# Patient Record
Sex: Female | Born: 1960 | Race: White | Hispanic: No | Marital: Married | Smoking: Never smoker
Health system: Southern US, Community
[De-identification: ages and names within clinical notes are randomized; demographics above are authoritative.]

## PROBLEM LIST (undated history)

## (undated) DIAGNOSIS — M419 Scoliosis, unspecified: Secondary | ICD-10-CM

## (undated) DIAGNOSIS — K219 Gastro-esophageal reflux disease without esophagitis: Secondary | ICD-10-CM

## (undated) DIAGNOSIS — Z8489 Family history of other specified conditions: Secondary | ICD-10-CM

## (undated) DIAGNOSIS — I10 Essential (primary) hypertension: Secondary | ICD-10-CM

## (undated) DIAGNOSIS — E785 Hyperlipidemia, unspecified: Secondary | ICD-10-CM

## (undated) DIAGNOSIS — M810 Age-related osteoporosis without current pathological fracture: Secondary | ICD-10-CM

## (undated) DIAGNOSIS — D72819 Decreased white blood cell count, unspecified: Secondary | ICD-10-CM

## (undated) DIAGNOSIS — F419 Anxiety disorder, unspecified: Secondary | ICD-10-CM

## (undated) HISTORY — PX: COLONOSCOPY WITH PROPOFOL: SHX5780

## (undated) HISTORY — PX: HYSTEROSCOPY DIAGNOSTIC: PRO49

---

## 2006-01-13 ENCOUNTER — Ambulatory Visit: Payer: Self-pay | Admitting: Unknown Physician Specialty

## 2007-04-18 ENCOUNTER — Ambulatory Visit: Payer: Self-pay | Admitting: Unknown Physician Specialty

## 2007-08-17 ENCOUNTER — Emergency Department (HOSPITAL_COMMUNITY): Admission: EM | Admit: 2007-08-17 | Discharge: 2007-08-17 | Payer: Self-pay | Admitting: Emergency Medicine

## 2007-09-01 ENCOUNTER — Ambulatory Visit: Payer: Self-pay | Admitting: Internal Medicine

## 2007-09-13 ENCOUNTER — Ambulatory Visit: Payer: Self-pay | Admitting: Thoracic Surgery

## 2007-10-10 ENCOUNTER — Ambulatory Visit: Payer: Self-pay | Admitting: Thoracic Surgery

## 2007-10-10 ENCOUNTER — Encounter: Admission: RE | Admit: 2007-10-10 | Discharge: 2007-10-10 | Payer: Self-pay | Admitting: Thoracic Surgery

## 2007-12-13 ENCOUNTER — Encounter: Admission: RE | Admit: 2007-12-13 | Discharge: 2007-12-13 | Payer: Self-pay | Admitting: Thoracic Surgery

## 2007-12-13 ENCOUNTER — Ambulatory Visit: Payer: Self-pay | Admitting: Thoracic Surgery

## 2008-04-23 ENCOUNTER — Ambulatory Visit: Payer: Self-pay | Admitting: Unknown Physician Specialty

## 2009-04-22 ENCOUNTER — Ambulatory Visit: Payer: Self-pay | Admitting: Unknown Physician Specialty

## 2010-04-28 ENCOUNTER — Ambulatory Visit: Payer: Self-pay | Admitting: Unknown Physician Specialty
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
## 2010-09-08 NOTE — Assessment & Plan Note (Signed)
OFFICE VISIT   MEOSHIA, BILLING  DOB:  June 07, 1960                                        October 10, 2007  CHART #:  [id number]   Ms. Krinke came for followup today.  She has no sternal pain.  Her  sternum is stable.  Chest x-ray shows severe scoliosis, no effusion, and  we can see her sternal fracture, which appears to be healing and forming  some callus formation, but it still has some depression there.  We plan  to see her back again in 2 months with another chest x-ray.  I released  her to full activity.   Ines Bloomer, M.D.  Electronically Signed   DPB/MEDQ  D:  10/10/2007  T:  10/11/2007  Job:  [id number]

## 2010-09-08 NOTE — Letter (Signed)
December 13, 2007   Kizzie Ide. Particia Nearing, MD  [address]  [place], [place] [address]   Re:  Megan Lin, Megan Lin              DOB:  1961/01/12   Dear Dr. Particia Nearing:   I saw the patient back today for a followup of her sternal fracture.  Obviously, her chest x-ray still shows a thoracic scoliosis as well as  some step off of her sternal fracture, but she can see callus formation  and appears to be healing satisfactory and she has minimal pain and no  evidence of any movement.  She probably remained like this for now on  and do not feel anything else needed to be done.  I would suggest that  she probably get a bone density in the near future to see if she needs  to go on any medications.  I will be happy to see her again if she has  any future problems.  Her blood pressure was 122/84, pulse 82,  respirations 20, and sats were 100%.  Lungs were clear to auscultation  and percussion.   Ines Bloomer, M.D.  Electronically Signed   DPB/MEDQ  D:  12/13/2007  T:  12/13/2007  Job:  [id number]   cc:   Curtis Sites, MD

## 2011-05-17 ENCOUNTER — Ambulatory Visit: Payer: Self-pay | Admitting: Unknown Physician Specialty

## 2011-06-10 ENCOUNTER — Ambulatory Visit: Payer: Self-pay | Admitting: Internal Medicine

## 2012-06-13 ENCOUNTER — Ambulatory Visit: Payer: Self-pay | Admitting: Internal Medicine

## 2013-06-27 ENCOUNTER — Ambulatory Visit: Payer: Self-pay | Admitting: Internal Medicine

## 2013-06-28 ENCOUNTER — Ambulatory Visit: Payer: Self-pay | Admitting: Internal Medicine

## 2014-07-12 ENCOUNTER — Ambulatory Visit: Payer: Self-pay | Admitting: Unknown Physician Specialty

## 2014-12-11 ENCOUNTER — Encounter: Payer: Self-pay | Admitting: *Deleted

## 2014-12-12 ENCOUNTER — Encounter: Payer: Self-pay | Admitting: *Deleted

## 2014-12-12 ENCOUNTER — Ambulatory Visit: Payer: BLUE CROSS/BLUE SHIELD | Admitting: Anesthesiology

## 2014-12-12 ENCOUNTER — Encounter
Admission: RE | Disposition: A | Discharge status: Home / Self Care | Payer: Self-pay | Source: Ambulatory Visit | Attending: Unknown Physician Specialty

## 2014-12-12 ENCOUNTER — Ambulatory Visit
Admission: RE | Admit: 2014-12-12 | Discharge: 2014-12-12 | Disposition: A | Discharge status: Home / Self Care | Payer: BLUE CROSS/BLUE SHIELD | Source: Ambulatory Visit | Attending: Unknown Physician Specialty | Admitting: Unknown Physician Specialty

## 2014-12-12 DIAGNOSIS — M810 Age-related osteoporosis without current pathological fracture: Secondary | ICD-10-CM | POA: Insufficient documentation

## 2014-12-12 DIAGNOSIS — E785 Hyperlipidemia, unspecified: Secondary | ICD-10-CM | POA: Diagnosis not present

## 2014-12-12 DIAGNOSIS — F419 Anxiety disorder, unspecified: Secondary | ICD-10-CM | POA: Diagnosis not present

## 2014-12-12 DIAGNOSIS — K219 Gastro-esophageal reflux disease without esophagitis: Secondary | ICD-10-CM | POA: Insufficient documentation

## 2014-12-12 DIAGNOSIS — K2950 Unspecified chronic gastritis without bleeding: Secondary | ICD-10-CM | POA: Insufficient documentation

## 2014-12-12 DIAGNOSIS — Z79899 Other long term (current) drug therapy: Secondary | ICD-10-CM | POA: Diagnosis not present

## 2014-12-12 DIAGNOSIS — R12 Heartburn: Secondary | ICD-10-CM | POA: Diagnosis present

## 2014-12-12 HISTORY — PX: ESOPHAGOGASTRODUODENOSCOPY (EGD) WITH PROPOFOL: SHX5813

## 2014-12-12 HISTORY — DX: Family history of other specified conditions: Z84.89

## 2014-12-12 HISTORY — DX: Age-related osteoporosis without current pathological fracture: M81.0

## 2014-12-12 HISTORY — DX: Anxiety disorder, unspecified: F41.9

## 2014-12-12 HISTORY — DX: Hyperlipidemia, unspecified: E78.5

## 2014-12-12 HISTORY — DX: Scoliosis, unspecified: M41.9

## 2014-12-12 HISTORY — DX: Decreased white blood cell count, unspecified: D72.819

## 2014-12-12 HISTORY — DX: Gastro-esophageal reflux disease without esophagitis: K21.9

## 2014-12-12 SURGERY — ESOPHAGOGASTRODUODENOSCOPY (EGD) WITH PROPOFOL
Anesthesia: General

## 2014-12-12 MED ORDER — SODIUM CHLORIDE 0.9 % IV SOLN
INTRAVENOUS | Status: DC
  Administered 2014-12-12: 13:00:00 via INTRAVENOUS

## 2014-12-12 MED ORDER — PROPOFOL 10 MG/ML IV BOLUS
INTRAVENOUS | Status: DC | PRN
  Administered 2014-12-12: 50 mg via INTRAVENOUS

## 2014-12-12 MED ORDER — MIDAZOLAM HCL 5 MG/5ML IJ SOLN
INTRAMUSCULAR | Status: DC | PRN
  Administered 2014-12-12: 1 mg via INTRAVENOUS

## 2014-12-12 MED ORDER — PROPOFOL INFUSION 10 MG/ML OPTIME
INTRAVENOUS | Status: DC | PRN
  Administered 2014-12-12: 100 ug/kg/min via INTRAVENOUS

## 2014-12-12 MED ORDER — LIDOCAINE HCL (PF) 2 % IJ SOLN
INTRAMUSCULAR | Status: DC | PRN
  Administered 2014-12-12: 50 mg

## 2014-12-12 MED ORDER — SODIUM CHLORIDE 0.9 % IV SOLN: INTRAVENOUS | Status: DC

## 2014-12-12 MED ORDER — SODIUM CHLORIDE 0.9 % IV SOLN
INTRAVENOUS | Status: DC
  Administered 2014-12-12: 1000 mL via INTRAVENOUS

## 2014-12-12 MED ORDER — FENTANYL CITRATE (PF) 100 MCG/2ML IJ SOLN
INTRAMUSCULAR | Status: DC | PRN
  Administered 2014-12-12: 50 ug via INTRAVENOUS

## 2014-12-12 NOTE — Anesthesia Preprocedure Evaluation (Signed)
Anesthesia Evaluation  Patient identified by MRN, date of birth, ID band Patient awake    Reviewed: Allergy & Precautions, H&P , NPO status , Patient's Chart, lab work & pertinent test results, reviewed documented beta blocker date and time   Airway Mallampati: II  TM Distance: >3 FB Neck ROM: full    Dental no notable dental hx.    Pulmonary neg pulmonary ROS,  breath sounds clear to auscultation  Pulmonary exam normal       Cardiovascular Exercise Tolerance: Good negative cardio ROS  Rhythm:regular Rate:Normal     Neuro/Psych PSYCHIATRIC DISORDERS negative neurological ROS     GI/Hepatic Neg liver ROS, GERD-  ,  Endo/Other  negative endocrine ROS  Renal/GU negative Renal ROS  negative genitourinary   Musculoskeletal   Abdominal   Peds  Hematology negative hematology ROS (+)   Anesthesia Other Findings   Reproductive/Obstetrics negative OB ROS                             Anesthesia Physical Anesthesia Plan  ASA: II  Anesthesia Plan: General   Post-op Pain Management:    Induction:   Airway Management Planned:   Additional Equipment:   Intra-op Plan:   Post-operative Plan:   Informed Consent: I have reviewed the patients History and Physical, chart, labs and discussed the procedure including the risks, benefits and alternatives for the proposed anesthesia with the patient or authorized representative who has indicated his/her understanding and acceptance.   Dental Advisory Given  Plan Discussed with: CRNA  Anesthesia Plan Comments:         Anesthesia Quick Evaluation

## 2014-12-12 NOTE — Op Note (Signed)
[facility] Gastroenterology Patient Name: Megan Lin Procedure Date: 12/12/2014 12:36 PM MRN: [id number] Account #: [id number] Date of Birth: 1960/09/28 Admit Type: Outpatient Age: 54 Room: [facility] ENDO ROOM 1 Gender: Female Note Status: Finalized Procedure:         Upper GI endoscopy Indications:       Heartburn Providers:         Manya Silvas, MD Referring MD:      Ramonita Lab, MD (Referring MD) Medicines:         Propofol per Anesthesia Complications:     No immediate complications. Procedure:         Pre-Anesthesia Assessment:                    - After reviewing the risks and benefits, the patient was                     deemed in satisfactory condition to undergo the procedure.                    After obtaining informed consent, the endoscope was passed                     under direct vision. Throughout the procedure, the                     patient's blood pressure, pulse, and oxygen saturations                     were monitored continuously. The Olympus GIF-160 endoscope                     (S#. [id number]) was introduced through the mouth, and                     advanced to the second part of duodenum. The upper GI                     endoscopy was accomplished without difficulty. The patient                     tolerated the procedure well. Findings:      The examined esophagus was normal.      Diffuse mild inflammation characterized by erythema and granularity was       found in the gastric antrum. Biopsies were taken with a cold forceps for       histology. Biopsies were taken with a cold forceps for Helicobacter       pylori testing.      The examined duodenum was normal. Impression:        - Normal esophagus.                    - Gastritis. Biopsied.                    - Normal examined duodenum. Recommendation:    - Await pathology results. Manya Silvas, MD 12/12/2014 1:26:50 PM This report has been signed electronically. Number of  Addenda: 0 Note Initiated On: 12/12/2014 12:36 PM      [facility]

## 2014-12-12 NOTE — Transfer of Care (Signed)
Immediate Anesthesia Transfer of Care Note  Patient: Megan Lin  Procedure(s) Performed: Procedure(s): ESOPHAGOGASTRODUODENOSCOPY (EGD) WITH PROPOFOL (N/A)  Patient Location: PACU  Anesthesia Type:General  Level of Consciousness: sedated  Airway & Oxygen Therapy: Patient Spontanous Breathing and Patient connected to nasal cannula oxygen  Post-op Assessment: Report given to RN and Post -op Vital signs reviewed and stable  Post vital signs: Reviewed and stable  Last Vitals:  Filed Vitals:   12/12/14 1242  BP: 159/89  Pulse: 76  Temp: 36.8 C  Resp: 17    Complications: No apparent anesthesia complications

## 2014-12-12 NOTE — Anesthesia Postprocedure Evaluation (Signed)
  Anesthesia Post-op Note  Patient: Megan Lin  Procedure(s) Performed: Procedure(s): ESOPHAGOGASTRODUODENOSCOPY (EGD) WITH PROPOFOL (N/A)  Anesthesia type:General  Patient location: PACU  Post pain: Pain level controlled  Post assessment: Post-op Vital signs reviewed, Patient's Cardiovascular Status Stable, Respiratory Function Stable, Patent Airway and No signs of Nausea or vomiting  Post vital signs: Reviewed and stable  Last Vitals:  Filed Vitals:   12/12/14 1402  BP: 128/80  Pulse: 79  Temp:   Resp: 12    Level of consciousness: awake, alert  and patient cooperative  Complications: No apparent anesthesia complications

## 2014-12-12 NOTE — H&P (Signed)
   Primary Care Physician:  Adin Hector, MD Primary Gastroenterologist:  Dr. Vira Agar  Pre-Procedure History & Physical: HPI:  Megan Lin is a 54 y.o. female is here for an endoscopy.   Past Medical History  Diagnosis Date  . Anxiety   . Scoliosis   . Osteoporosis   . Hyperlipidemia   . Leukopenia   . GERD (gastroesophageal reflux disease)   . Family history of adverse reaction to anesthesia     History reviewed. No pertinent past surgical history.  Prior to Admission medications   Medication Sig Start Date End Date Taking? Authorizing Provider  calcium carbonate (OS-CAL) 600 MG TABS tablet Take 600 mg by mouth 2 (two) times daily with a meal.   Yes Historical Provider, MD  Misc Natural Products (ESTROVEN + ENERGY MAX STRENGTH PO) Take 400 mcg by mouth.   Yes Historical Provider, MD  omeprazole (PRILOSEC) 20 MG capsule Take 20 mg by mouth daily.   Yes Historical Provider, MD  sertraline (ZOLOFT) 50 MG tablet Take 50 mg by mouth daily.   Yes Historical Provider, MD    Allergies as of 12/09/2014  . (Not on File)    History reviewed. No pertinent family history.  Social History   Social History  . Marital Status: Married    Spouse Name: N/A  . Number of Children: N/A  . Years of Education: N/A   Occupational History  . Not on file.   Social History Main Topics  . Smoking status: Never Smoker   . Smokeless tobacco: Never Used  . Alcohol Use: 0.0 - 0.6 oz/week    0-1 Glasses of wine per week  . Drug Use: No  . Sexual Activity: Not on file   Other Topics Concern  . Not on file   Social History Narrative    Review of Systems: See HPI, otherwise negative ROS  Physical Exam: BP 159/89 mmHg  Pulse 76  Temp(Src) 98.2 F (36.8 C) (Tympanic)  Resp 17  Ht 5\' 2"  (1.575 m)  Wt 60.782 kg (134 lb)  BMI 24.50 kg/m2  SpO2 100% General:   Alert,  pleasant and cooperative in NAD Head:  Normocephalic and atraumatic. Neck:  Supple; no masses or  thyromegaly. Lungs:  Clear throughout to auscultation.    Heart:  Regular rate and rhythm. Abdomen:  Soft, nontender and nondistended. Normal bowel sounds, without guarding, and without rebound.   Neurologic:  Alert and  oriented x4;  grossly normal neurologically.  Impression/Plan: Megan Lin is here for an endoscopy to be performed for heartburn  Risks, benefits, limitations, and alternatives regarding  endoscopy have been reviewed with the patient.  Questions have been answered.  All parties agreeable.   Gaylyn Cheers, MD  12/12/2014, 1:14 PM

## 2014-12-13 ENCOUNTER — Encounter: Payer: Self-pay | Admitting: Unknown Physician Specialty

## 2014-12-13 LAB — SURGICAL PATHOLOGY

## 2015-05-12 ENCOUNTER — Other Ambulatory Visit: Payer: Self-pay | Admitting: Internal Medicine

## 2015-05-12 DIAGNOSIS — Z1231 Encounter for screening mammogram for malignant neoplasm of breast: Secondary | ICD-10-CM

## 2015-05-13 ENCOUNTER — Other Ambulatory Visit: Payer: Self-pay | Admitting: Internal Medicine

## 2015-05-13 DIAGNOSIS — M810 Age-related osteoporosis without current pathological fracture: Secondary | ICD-10-CM

## 2015-07-14 ENCOUNTER — Ambulatory Visit: Payer: BLUE CROSS/BLUE SHIELD

## 2015-07-24 ENCOUNTER — Ambulatory Visit
Admission: RE | Admit: 2015-07-24 | Discharge: 2015-07-24 | Disposition: A | Discharge status: Home / Self Care | Payer: BLUE CROSS/BLUE SHIELD | Source: Ambulatory Visit | Attending: Internal Medicine | Admitting: Internal Medicine

## 2015-07-24 DIAGNOSIS — Z1231 Encounter for screening mammogram for malignant neoplasm of breast: Secondary | ICD-10-CM | POA: Insufficient documentation

## 2015-07-24 DIAGNOSIS — Z13820 Encounter for screening for osteoporosis: Secondary | ICD-10-CM | POA: Insufficient documentation

## 2015-07-24 DIAGNOSIS — M810 Age-related osteoporosis without current pathological fracture: Secondary | ICD-10-CM

## 2015-07-24 DIAGNOSIS — M8580 Other specified disorders of bone density and structure, unspecified site: Secondary | ICD-10-CM | POA: Insufficient documentation

## 2016-07-06 ENCOUNTER — Other Ambulatory Visit: Payer: Self-pay | Admitting: Internal Medicine

## 2016-07-06 DIAGNOSIS — Z1231 Encounter for screening mammogram for malignant neoplasm of breast: Secondary | ICD-10-CM

## 2016-08-06 ENCOUNTER — Ambulatory Visit
Admission: RE | Admit: 2016-08-06 | Discharge: 2016-08-06 | Disposition: A | Discharge status: Home / Self Care | Payer: No Typology Code available for payment source | Source: Ambulatory Visit | Attending: Internal Medicine | Admitting: Internal Medicine

## 2016-08-06 DIAGNOSIS — Z1231 Encounter for screening mammogram for malignant neoplasm of breast: Secondary | ICD-10-CM | POA: Diagnosis not present

## 2017-10-24 ENCOUNTER — Other Ambulatory Visit: Payer: Self-pay | Admitting: Internal Medicine

## 2017-10-24 DIAGNOSIS — Z1231 Encounter for screening mammogram for malignant neoplasm of breast: Secondary | ICD-10-CM

## 2017-11-07 ENCOUNTER — Ambulatory Visit
Admission: RE | Admit: 2017-11-07 | Discharge: 2017-11-07 | Disposition: A | Discharge status: Home / Self Care | Payer: No Typology Code available for payment source | Source: Ambulatory Visit | Attending: Internal Medicine | Admitting: Internal Medicine

## 2017-11-07 DIAGNOSIS — Z1231 Encounter for screening mammogram for malignant neoplasm of breast: Secondary | ICD-10-CM | POA: Diagnosis not present

## 2017-12-19 ENCOUNTER — Other Ambulatory Visit: Payer: Self-pay | Admitting: Internal Medicine

## 2017-12-19 DIAGNOSIS — M818 Other osteoporosis without current pathological fracture: Secondary | ICD-10-CM

## 2017-12-28 ENCOUNTER — Ambulatory Visit
Admission: RE | Admit: 2017-12-28 | Discharge: 2017-12-28 | Disposition: A | Discharge status: Home / Self Care | Payer: No Typology Code available for payment source | Source: Ambulatory Visit | Attending: Internal Medicine | Admitting: Internal Medicine

## 2017-12-28 DIAGNOSIS — M818 Other osteoporosis without current pathological fracture: Secondary | ICD-10-CM | POA: Diagnosis present

## 2018-10-09 ENCOUNTER — Other Ambulatory Visit: Payer: Self-pay | Admitting: Internal Medicine

## 2018-10-09 DIAGNOSIS — Z1231 Encounter for screening mammogram for malignant neoplasm of breast: Secondary | ICD-10-CM

## 2018-11-15 ENCOUNTER — Ambulatory Visit
Admission: RE | Admit: 2018-11-15 | Discharge: 2018-11-15 | Disposition: A | Discharge status: Home / Self Care | Payer: PRIVATE HEALTH INSURANCE | Source: Ambulatory Visit | Attending: Internal Medicine | Admitting: Internal Medicine

## 2018-11-15 DIAGNOSIS — Z1231 Encounter for screening mammogram for malignant neoplasm of breast: Secondary | ICD-10-CM | POA: Insufficient documentation

## 2019-05-11 ENCOUNTER — Other Ambulatory Visit: Payer: Self-pay | Admitting: Sports Medicine

## 2019-05-11 DIAGNOSIS — G8929 Other chronic pain: Secondary | ICD-10-CM

## 2019-05-11 DIAGNOSIS — M25562 Pain in left knee: Secondary | ICD-10-CM

## 2019-05-11 DIAGNOSIS — M25462 Effusion, left knee: Secondary | ICD-10-CM

## 2019-09-04 ENCOUNTER — Other Ambulatory Visit: Payer: Self-pay | Admitting: Surgery

## 2019-09-12 ENCOUNTER — Encounter
Admission: RE | Admit: 2019-09-12 | Discharge: 2019-09-12 | Disposition: A | Discharge status: Home / Self Care | Payer: PRIVATE HEALTH INSURANCE | Source: Ambulatory Visit | Attending: Surgery | Admitting: Surgery

## 2019-09-12 ENCOUNTER — Other Ambulatory Visit: Payer: Self-pay

## 2019-09-12 DIAGNOSIS — I10 Essential (primary) hypertension: Secondary | ICD-10-CM | POA: Insufficient documentation

## 2019-09-12 DIAGNOSIS — Z01818 Encounter for other preprocedural examination: Secondary | ICD-10-CM | POA: Insufficient documentation

## 2019-09-12 HISTORY — DX: Essential (primary) hypertension: I10

## 2019-09-12 NOTE — Patient Instructions (Addendum)
Your procedure is scheduled on: Wednesday 09/19/19.  Report to DAY SURGERY DEPARTMENT LOCATED ON 2ND FLOOR MEDICAL MALL ENTRANCE. To find out your arrival time please call [phone] between 1PM - 3PM on Tuesday 09/18/19.   Remember: Instructions that are not followed completely may result in serious medical risk, up to and including death, or upon the discretion of your surgeon and anesthesiologist your surgery may need to be rescheduled.      _X__ 1. Do not eat food after midnight the night before your procedure.                 No gum chewing or hard candies. You may drink clear liquids up to 2 hours                 before you are scheduled to arrive for your surgery- DO NOT drink clear                 liquids within 2 hours of the start of your surgery.                 Clear Liquids include:  water, apple juice without pulp, clear carbohydrate                 drink such as Clearfast or Gatorade, Black Coffee or Tea (Do not add                 anything to coffee or tea).  ** Dr. Roland Rack would like for you to finish your Pre-Surgery Ensure on the morning of your surgery 2 hours prior to your arrival time. **  __X__2.  On the morning of surgery brush your teeth with toothpaste and water, you may rinse your mouth with mouthwash if you wish.  Do not swallow any toothpaste or mouthwash.       _X__ 3.  No Alcohol for 24 hours before or after surgery.    __X__4.  Notify your doctor if there is any change in your medical condition      (cold, fever, infections).       Do not wear jewelry, make-up, hairpins, clips or nail polish. Do not wear lotions, powders, or perfumes.  Do not shave 48 hours prior to surgery. Men may shave face and neck. Do not bring valuables to the hospital.      [facility] is not responsible for any belongings or valuables.   Contacts, dentures/partials or body piercings may not be worn into surgery. Bring a case for your contacts, glasses or hearing aids,  a denture cup will be supplied.   Patients discharged the day of surgery will not be allowed to drive home.    __X__ Take these medicines the morning of surgery with A SIP OF WATER:     1. omeprazole (PRILOSEC)  2. acetaminophen (TYLENOL) if needed     __X__ Use CHG Soap as directed   __X__ Stop Anti-inflammatories 7 days before surgery such as Advil, Ibuprofen, Motrin, BC or Goodies Powder, Naprosyn, Naproxen, Aleve, Aspirin, Meloxicam. May take Tylenol if needed for pain or discomfort.    __X__ Don't start taking any new herbal supplements or vitamins prior to your procedure.

## 2019-09-17 ENCOUNTER — Other Ambulatory Visit: Payer: Self-pay

## 2019-09-17 ENCOUNTER — Encounter
Admission: RE | Admit: 2019-09-17 | Discharge: 2019-09-17 | Disposition: A | Discharge status: Home / Self Care | Payer: Self-pay | Source: Ambulatory Visit | Attending: Surgery | Admitting: Surgery

## 2019-09-17 DIAGNOSIS — Z20822 Contact with and (suspected) exposure to covid-19: Secondary | ICD-10-CM | POA: Insufficient documentation

## 2019-09-17 DIAGNOSIS — Z01818 Encounter for other preprocedural examination: Secondary | ICD-10-CM | POA: Insufficient documentation

## 2019-09-17 LAB — BASIC METABOLIC PANEL
Anion gap: 9 (ref 5–15)
BUN: 13 mg/dL (ref 6–20)
CO2: 27 mmol/L (ref 22–32)
Calcium: 9.1 mg/dL (ref 8.9–10.3)
Chloride: 101 mmol/L (ref 98–111)
Creatinine, Ser: 0.7 mg/dL (ref 0.44–1.00)
GFR calc Af Amer: >60 mL/min (ref >60)
GFR calc non Af Amer: >60 mL/min (ref >60)
Glucose, Bld: 90 mg/dL (ref 70–99)
Potassium: 3.5 mmol/L (ref 3.5–5.1)
Sodium: 137 mmol/L (ref 135–145)

## 2019-09-17 LAB — CBC
HCT: 39.5 % (ref 36.0–46.0)
Hemoglobin: 13.3 g/dL (ref 12.0–15.0)
MCH: 30 pg (ref 26.0–34.0)
MCHC: 33.7 g/dL (ref 30.0–36.0)
MCV: 89.2 fL (ref 80.0–100.0)
Platelets: 280 10*3/uL (ref 150–400)
RBC: 4.43 MIL/uL (ref 3.87–5.11)
RDW: 12.6 % (ref 11.5–15.5)
WBC: 4.5 10*3/uL (ref 4.0–10.5)
nRBC: 0 % (ref 0.0–0.2)

## 2019-09-17 LAB — SARS CORONAVIRUS 2 (TAT 6-24 HRS): SARS Coronavirus 2: NEGATIVE

## 2019-09-19 ENCOUNTER — Ambulatory Visit: Payer: Self-pay | Admitting: Certified Registered"

## 2019-09-19 ENCOUNTER — Other Ambulatory Visit: Payer: Self-pay

## 2019-09-19 ENCOUNTER — Encounter: Payer: Self-pay | Admitting: Surgery

## 2019-09-19 ENCOUNTER — Ambulatory Visit
Admission: RE | Admit: 2019-09-19 | Discharge: 2019-09-19 | Disposition: A | Discharge status: Home / Self Care | Payer: Self-pay | Attending: Surgery | Admitting: Surgery

## 2019-09-19 ENCOUNTER — Encounter
Admission: RE | Disposition: A | Discharge status: Home / Self Care | Payer: Self-pay | Source: Home / Self Care | Attending: Surgery

## 2019-09-19 DIAGNOSIS — M810 Age-related osteoporosis without current pathological fracture: Secondary | ICD-10-CM | POA: Insufficient documentation

## 2019-09-19 DIAGNOSIS — M6752 Plica syndrome, left knee: Secondary | ICD-10-CM | POA: Insufficient documentation

## 2019-09-19 DIAGNOSIS — K219 Gastro-esophageal reflux disease without esophagitis: Secondary | ICD-10-CM | POA: Insufficient documentation

## 2019-09-19 DIAGNOSIS — M1712 Unilateral primary osteoarthritis, left knee: Secondary | ICD-10-CM | POA: Insufficient documentation

## 2019-09-19 DIAGNOSIS — F419 Anxiety disorder, unspecified: Secondary | ICD-10-CM | POA: Insufficient documentation

## 2019-09-19 DIAGNOSIS — M23201 Derangement of unspecified lateral meniscus due to old tear or injury, left knee: Secondary | ICD-10-CM | POA: Insufficient documentation

## 2019-09-19 DIAGNOSIS — E785 Hyperlipidemia, unspecified: Secondary | ICD-10-CM | POA: Insufficient documentation

## 2019-09-19 DIAGNOSIS — M2242 Chondromalacia patellae, left knee: Secondary | ICD-10-CM | POA: Insufficient documentation

## 2019-09-19 DIAGNOSIS — Z79899 Other long term (current) drug therapy: Secondary | ICD-10-CM | POA: Insufficient documentation

## 2019-09-19 HISTORY — PX: KNEE ARTHROSCOPY WITH LATERAL MENISECTOMY: SHX6193

## 2019-09-19 SURGERY — ARTHROSCOPY, KNEE, WITH LATERAL MENISCECTOMY
Anesthesia: General | Site: Knee | Laterality: Left

## 2019-09-19 MED ORDER — BUPIVACAINE-EPINEPHRINE (PF) 0.5% -1:200000 IJ SOLN
INTRAMUSCULAR | Status: DC | PRN
  Administered 2019-09-19: 50 mL via PERINEURAL

## 2019-09-19 MED ORDER — LIDOCAINE HCL (PF) 1 % IJ SOLN
INTRAMUSCULAR | Status: AC
  Filled 2019-09-19: qty 30

## 2019-09-19 MED ORDER — FENTANYL CITRATE (PF) 100 MCG/2ML IJ SOLN
INTRAMUSCULAR | Status: AC
  Filled 2019-09-19: qty 2

## 2019-09-19 MED ORDER — FENTANYL CITRATE (PF) 100 MCG/2ML IJ SOLN
INTRAMUSCULAR | Status: DC | PRN
  Administered 2019-09-19 (×2): 50 ug via INTRAVENOUS

## 2019-09-19 MED ORDER — ONDANSETRON HCL 4 MG/2ML IJ SOLN: 4.0000 mg | Freq: Four times a day (QID) | INTRAMUSCULAR | Status: DC | PRN

## 2019-09-19 MED ORDER — KETOROLAC TROMETHAMINE 30 MG/ML IJ SOLN
INTRAMUSCULAR | Status: DC | PRN
  Administered 2019-09-19: 30 mg via INTRAVENOUS

## 2019-09-19 MED ORDER — LIDOCAINE HCL (PF) 2 % IJ SOLN
INTRAMUSCULAR | Status: AC
  Filled 2019-09-19: qty 10

## 2019-09-19 MED ORDER — LIDOCAINE HCL 1 % IJ SOLN
INTRAMUSCULAR | Status: DC | PRN
  Administered 2019-09-19: 30 mL

## 2019-09-19 MED ORDER — METOCLOPRAMIDE HCL 10 MG PO TABS: 5.0000 mg | ORAL_TABLET | Freq: Three times a day (TID) | ORAL | Status: DC | PRN

## 2019-09-19 MED ORDER — CLINDAMYCIN PHOSPHATE 900 MG/50ML IV SOLN
INTRAVENOUS | Status: AC
  Filled 2019-09-19: qty 50

## 2019-09-19 MED ORDER — EPHEDRINE SULFATE 50 MG/ML IJ SOLN
INTRAMUSCULAR | Status: DC | PRN
  Administered 2019-09-19 (×2): 10 mg via INTRAVENOUS

## 2019-09-19 MED ORDER — METOCLOPRAMIDE HCL 5 MG/ML IJ SOLN: 5.0000 mg | Freq: Three times a day (TID) | INTRAMUSCULAR | Status: DC | PRN

## 2019-09-19 MED ORDER — DEXAMETHASONE SODIUM PHOSPHATE 10 MG/ML IJ SOLN
INTRAMUSCULAR | Status: AC
  Filled 2019-09-19: qty 1

## 2019-09-19 MED ORDER — PROPOFOL 10 MG/ML IV BOLUS
INTRAVENOUS | Status: DC | PRN
  Administered 2019-09-19: 160 mg via INTRAVENOUS

## 2019-09-19 MED ORDER — PHENYLEPHRINE HCL (PRESSORS) 10 MG/ML IV SOLN
INTRAVENOUS | Status: DC | PRN
  Administered 2019-09-19 (×6): 100 ug via INTRAVENOUS

## 2019-09-19 MED ORDER — KETOROLAC TROMETHAMINE 30 MG/ML IJ SOLN
INTRAMUSCULAR | Status: AC
  Filled 2019-09-19: qty 1

## 2019-09-19 MED ORDER — HYDROCODONE-ACETAMINOPHEN 5-325 MG PO TABS: 1.0000 | ORAL_TABLET | Freq: Four times a day (QID) | ORAL | 0 refills | Status: AC | PRN

## 2019-09-19 MED ORDER — LIDOCAINE HCL (CARDIAC) PF 100 MG/5ML IV SOSY
PREFILLED_SYRINGE | INTRAVENOUS | Status: DC | PRN
  Administered 2019-09-19: 40 mg via INTRAVENOUS

## 2019-09-19 MED ORDER — EPHEDRINE 5 MG/ML INJ
INTRAVENOUS | Status: AC
  Filled 2019-09-19: qty 10

## 2019-09-19 MED ORDER — ONDANSETRON HCL 4 MG/2ML IJ SOLN
INTRAMUSCULAR | Status: DC | PRN
  Administered 2019-09-19: 4 mg via INTRAVENOUS

## 2019-09-19 MED ORDER — DEXAMETHASONE SODIUM PHOSPHATE 10 MG/ML IJ SOLN
INTRAMUSCULAR | Status: DC | PRN
  Administered 2019-09-19: 5 mg via INTRAVENOUS

## 2019-09-19 MED ORDER — HYDROCODONE-ACETAMINOPHEN 5-325 MG PO TABS: 1.0000 | ORAL_TABLET | ORAL | Status: DC | PRN

## 2019-09-19 MED ORDER — LACTATED RINGERS IV SOLN: INTRAVENOUS | Status: DC

## 2019-09-19 MED ORDER — EPINEPHRINE PF 1 MG/ML IJ SOLN
INTRAMUSCULAR | Status: AC
  Filled 2019-09-19: qty 1

## 2019-09-19 MED ORDER — BUPIVACAINE HCL (PF) 0.5 % IJ SOLN
INTRAMUSCULAR | Status: AC
  Filled 2019-09-19: qty 60

## 2019-09-19 MED ORDER — PROPOFOL 10 MG/ML IV BOLUS
INTRAVENOUS | Status: AC
  Filled 2019-09-19: qty 20

## 2019-09-19 MED ORDER — MIDAZOLAM HCL 2 MG/2ML IJ SOLN
INTRAMUSCULAR | Status: AC
  Filled 2019-09-19: qty 2

## 2019-09-19 MED ORDER — MIDAZOLAM HCL 2 MG/2ML IJ SOLN
INTRAMUSCULAR | Status: DC | PRN
  Administered 2019-09-19: 2 mg via INTRAVENOUS

## 2019-09-19 MED ORDER — ONDANSETRON HCL 4 MG PO TABS: 4.0000 mg | ORAL_TABLET | Freq: Four times a day (QID) | ORAL | Status: DC | PRN

## 2019-09-19 MED ORDER — CLINDAMYCIN PHOSPHATE 900 MG/50ML IV SOLN
900.0000 mg | INTRAVENOUS | Status: AC
  Administered 2019-09-19: 900 mg via INTRAVENOUS

## 2019-09-19 MED ORDER — ONDANSETRON HCL 4 MG/2ML IJ SOLN
INTRAMUSCULAR | Status: AC
  Filled 2019-09-19: qty 2

## 2019-09-19 SURGICAL SUPPLY — 36 items
BAG COUNTER SPONGE EZ (MISCELLANEOUS) IMPLANT
BLADE FULL RADIUS 3.5 (BLADE) ×3 IMPLANT
BLADE SHAVER 4.5X7 STR FR (MISCELLANEOUS) ×3 IMPLANT
BNDG ELASTIC 6X5.8 VLCR STR LF (GAUZE/BANDAGES/DRESSINGS) ×3 IMPLANT
CHLORAPREP W/TINT 26 (MISCELLANEOUS) ×3 IMPLANT
COUNTER SPONGE BAG EZ (MISCELLANEOUS)
COVER WAND RF STERILE (DRAPES) ×3 IMPLANT
CUFF TOURN SGL QUICK 24 (TOURNIQUET CUFF) ×3
CUFF TOURN SGL QUICK 30 (TOURNIQUET CUFF)
CUFF TRNQT CYL 24X4X16.5-23 (TOURNIQUET CUFF) ×1 IMPLANT
CUFF TRNQT CYL 30X4X21-28X (TOURNIQUET CUFF) IMPLANT
DRAPE IMP U-DRAPE 54X76 (DRAPES) ×3 IMPLANT
ELECT REM PT RETURN 9FT ADLT (ELECTROSURGICAL) ×3
ELECTRODE REM PT RTRN 9FT ADLT (ELECTROSURGICAL) ×1 IMPLANT
GAUZE SPONGE 4X4 12PLY STRL (GAUZE/BANDAGES/DRESSINGS) ×3 IMPLANT
GLOVE BIO SURGEON STRL SZ8 (GLOVE) ×6 IMPLANT
GLOVE BIOGEL M 7.0 STRL (GLOVE) ×6 IMPLANT
GLOVE BIOGEL PI IND STRL 7.5 (GLOVE) ×1 IMPLANT
GLOVE BIOGEL PI INDICATOR 7.5 (GLOVE) ×2
GLOVE INDICATOR 8.0 STRL GRN (GLOVE) ×3 IMPLANT
GOWN STRL REUS W/ TWL LRG LVL3 (GOWN DISPOSABLE) ×1 IMPLANT
GOWN STRL REUS W/ TWL XL LVL3 (GOWN DISPOSABLE) ×2 IMPLANT
GOWN STRL REUS W/TWL LRG LVL3 (GOWN DISPOSABLE) ×3
GOWN STRL REUS W/TWL XL LVL3 (GOWN DISPOSABLE) ×6
IV LACTATED RINGER IRRG 3000ML (IV SOLUTION) ×3
IV LR IRRIG 3000ML ARTHROMATIC (IV SOLUTION) ×1 IMPLANT
KIT TURNOVER KIT A (KITS) ×3 IMPLANT
MANIFOLD NEPTUNE II (INSTRUMENTS) ×3 IMPLANT
NEEDLE HYPO 21X1.5 SAFETY (NEEDLE) ×3 IMPLANT
PACK KNEE ARTHRO (MISCELLANEOUS) ×3 IMPLANT
PENCIL ELECTRO HAND CTR (MISCELLANEOUS) ×3 IMPLANT
SUT PROLENE 4 0 PS 2 18 (SUTURE) ×3 IMPLANT
SUT TICRON COATED BLUE 2 0 30 (SUTURE) IMPLANT
SYR 50ML LL SCALE MARK (SYRINGE) ×3 IMPLANT
TUBING ARTHRO INFLOW-ONLY STRL (TUBING) ×3 IMPLANT
WAND WEREWOLF FLOW 90D (MISCELLANEOUS) ×3 IMPLANT

## 2019-09-19 NOTE — Op Note (Signed)
09/19/2019  10:02 AM  Patient:   Megan Lin  Pre-Op Diagnosis:   Complex tear of lateral meniscus, medial shelf plica, and chondromalacia patella, left knee.  Postoperative diagnosis:   Same with early degenerative joint disease, left knee.  Procedure:   Arthroscopic debridement with excision of medial shelf plica; abrasion chondroplasty of grade II chondromalacia of patella, grade II chondromalacia of lateral femoral condyle, and grade III chondromalacia of lateral tibial plateau; and partial lateral meniscectomy, left knee.  Surgeon:   Pascal Lux, M.D.  Anesthesia:   General LMA.  Findings:   As above.  There also was an area of grade III chondromalacia measuring approximately 1.5 x 2 cm on the weightbearing portion of the medial femoral condyle.  The medial meniscus was in satisfactory condition, as were the anterior and posterior cruciate ligaments.  Complications:   None.  EBL:   5 cc.  Total fluids:   800 cc of crystalloid.  Tourniquet time:   None  Drains:   None  Closure:   4-0 Prolene interrupted sutures.  Brief clinical note:   The patient is a 59 year old female with an 8 to 31-month history of left knee pain without any antecedent injury. Her symptoms have persisted despite medications, activity modification, etc. Her history and examination are consistent with early degenerative joint disease with a possible plica and meniscus tear. A preoperative MRI scan suggested the presence of an anterior horn lateral meniscus tear as well as a symptomatic plica and chondromalacia patella. The patient presents at this time for arthroscopy, debridement, and partial lateral meniscectomy.  Procedure:   The patient was brought into the operating room and lain in the supine position. After adequate general laryngeal mask anesthesia was obtained, a timeout was performed to verify the appropriate side. The patient's left knee was injected sterilely using a solution of 30 cc of 1%  lidocaine and 30 cc of 0.5% Sensorcaine with epinephrine. The left lower extremity was prepped with ChloraPrep solution before being draped sterilely. Preoperative antibiotics were administered. The expected portal sites were injected with 0.5% Sensorcaine with epinephrine before the camera was placed in the anterolateral portal and instrumentation performed through the anteromedial portal.   The knee was sequentially examined beginning in the suprapatellar pouch, then progressing to the patellofemoral space, the medial gutter and compartment, the notch, and finally the lateral compartment and gutter. The findings were as described above. Abundant reactive synovial tissues anteriorly were debrided using the full-radius resector in order to improve visualization. Debridement of these tissues demonstrated the presence of a medial shelf plica which was debrided back to stable margins using the full-radius resector. The medial meniscus was carefully probed with the findings as described above.   Laterally, there were areas of degenerative tearing of the anterior and posterior portions of the lateral meniscus. These areas were debrided back to stable margins using the mini-munchers, the backbiters, and the full-radius resector. Subsequent probing of the remaining rim demonstrated excellent stability.   The areas of grade 2 and grade III chondromalacia involving the lateral tibial plateau, the lateral femoral condyle, and the patella were debrided back to stable margins using the full-radius resector. The ArthroCare wand was used on its lowest setting to lightly "anneal" these areas. The instruments were removed from the joint after suctioning the excess fluid.   The portal sites were closed using 4-0 Prolene interrupted sutures before a sterile bulky dressing was applied to the knee. The patient was then awakened, extubated, and returned to  the recovery room in satisfactory condition after tolerating the  procedure well.

## 2019-09-19 NOTE — Anesthesia Preprocedure Evaluation (Signed)
Anesthesia Evaluation  Patient identified by MRN, date of birth, ID band Patient awake  General Assessment Comment:Family hx: cousin had emergency C section and was intubated, didn't wake up for a long time, was told she had an enzyme deficiency. 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 (+) Teeth Intact, Dental Advisory Given   Pulmonary neg pulmonary ROS, neg sleep apnea, neg COPD, Patient abstained from smoking.Not current smoker,    Pulmonary exam normal breath sounds clear to auscultation       Cardiovascular Exercise Tolerance: Good METShypertension, (-) CAD and (-) Past MI (-) dysrhythmias  Rhythm:Regular Rate:Normal - Systolic murmurs    Neuro/Psych PSYCHIATRIC DISORDERS Anxiety negative neurological ROS  negative psych ROS   GI/Hepatic GERD  Medicated and Controlled,(+)     (-) substance abuse  ,   Endo/Other  neg diabetes  Renal/GU negative Renal ROS     Musculoskeletal   Abdominal   Peds  Hematology   Anesthesia Other Findings Past Medical History: No date: Anxiety No date: Family history of adverse reaction to anesthesia     Comment:  Maternal first cousin would not wake up. No date: GERD (gastroesophageal reflux disease) No date: Hyperlipidemia No date: Hypertension No date: Leukopenia No date: Osteoporosis No date: Scoliosis  Reproductive/Obstetrics                             Anesthesia Physical Anesthesia Plan  ASA: II  Anesthesia Plan: General   Post-op Pain Management:    Induction: Intravenous  PONV Risk Score and Plan: 3 and Ondansetron,  Dexamethasone and Midazolam  Airway Management Planned: LMA  Additional Equipment: None  Intra-op Plan:   Post-operative Plan: Extubation in OR  Informed Consent: I have reviewed the patients History and Physical, chart, labs and discussed the procedure including the risks, benefits and alternatives for the proposed anesthesia with the patient or authorized representative who has indicated his/her understanding and acceptance.     Dental advisory given  Plan Discussed with: CRNA and Surgeon  Anesthesia Plan Comments: (Discussed risks of anesthesia with patient, including PONV, sore throat, lip/dental damage. Rare risks discussed as well, such as cardiorespiratory and neurological sequelae. Patient understands.  Discussed in detail precautions for pseudocholinesterase deficiency based on family history. Will avoid succinylcholine. Patient encouraged to follow up with PCP if she wants to pursue genetic testing.)        Anesthesia Quick Evaluation

## 2019-09-19 NOTE — Discharge Instructions (Addendum)
Orthopedic discharge instructions: Keep dressing dry and intact.  May shower after dressing changed on post-op day #4 (Sunday).  Cover sutures with Band-Aids after drying off. Apply ice frequently to knee. Take ibuprofen 600-800 mg TID with meals for 7-10 days, then as necessary.      Tid = 3 TIMES PER DAY (every 8 hours) Take pain medication as prescribed or ES Tylenol when needed.  May weight-bear as tolerated - use crutches or walker as needed. Follow-up in 10-14 days or as scheduled.    AMBULATORY SURGERY  DISCHARGE INSTRUCTIONS   1) The drugs that you were given will stay in your system until tomorrow so for the next 24 hours you should not:  A) Drive an automobile B) Make any legal decisions C) Drink any alcoholic beverage   2) You may resume regular meals tomorrow.  Today it is better to start with liquids and gradually work up to solid foods.  You may eat anything you prefer, but it is better to start with liquids, then soup and crackers, and gradually work up to solid foods.   3) Please notify your doctor immediately if you have any unusual bleeding, trouble breathing, redness and pain at the surgery site, drainage, fever, or pain not relieved by medication.    4) Additional Instructions:        Please contact your physician with any problems or Same Day Surgery at [phone], Monday through Friday 6 am to 4 pm, or [place] at [facility] number at [phone].

## 2019-09-19 NOTE — Anesthesia Postprocedure Evaluation (Signed)
Anesthesia Post Note  Patient: Megan Lin  Procedure(s) Performed: Left knee arthroscopy with debridement, abrasion chondroplasty of the patella, excision of symptomatic plica, and repair versus partial lateral meniscectomy. (Left Knee)  Patient location during evaluation: Phase II Anesthesia Type: General Level of consciousness: awake and alert Pain management: pain level controlled Vital Signs Assessment: post-procedure vital signs reviewed and stable Respiratory status: spontaneous breathing, nonlabored ventilation, respiratory function stable and patient connected to nasal cannula oxygen Cardiovascular status: blood pressure returned to baseline and stable Postop Assessment: no apparent nausea or vomiting Anesthetic complications: no     Last Vitals:  Vitals:   09/19/19 1033 09/19/19 1048  BP: 125/70 114/74  Pulse: 87   Resp: 16 16  Temp: (!) 36.3 C   SpO2: 97% 99%    Last Pain:  Vitals:   09/19/19 1048  TempSrc:   PainSc: 0-No pain                 Arita Miss

## 2019-09-19 NOTE — Transfer of Care (Signed)
Immediate Anesthesia Transfer of Care Note  Patient: Megan Lin  Procedure(s) Performed: Left knee arthroscopy with debridement, abrasion chondroplasty of the patella, excision of symptomatic plica, and repair versus partial lateral meniscectomy. (Left Knee)  Patient Location: PACU  Anesthesia Type:General  Level of Consciousness: drowsy  Airway & Oxygen Therapy: Patient connected to nasal cannula oxygen  Post-op Assessment: Report given to RN  Post vital signs: stable  Last Vitals:  Vitals Value Taken Time  BP    Temp    Pulse 102 09/19/19 0955  Resp    SpO2 100 % 09/19/19 0955  Vitals shown include unvalidated device data.  Last Pain:  Vitals:   09/19/19 0727  TempSrc: Oral  PainSc: 2          Complications: No apparent anesthesia complications

## 2019-09-19 NOTE — Anesthesia Procedure Notes (Signed)
Procedure Name: LMA Insertion Date/Time: 09/19/2019 9:01 AM Performed by: Lerry Liner, CRNA Pre-anesthesia Checklist: Patient identified, Emergency Drugs available, Suction available, Patient being monitored and Timeout performed Patient Re-evaluated:Patient Re-evaluated prior to induction Oxygen Delivery Method: Circle system utilized Preoxygenation: Pre-oxygenation with 100% oxygen Ventilation: Mask ventilation without difficulty LMA: LMA inserted LMA Size: 4.0 Number of attempts: 1 Placement Confirmation: positive ETCO2 and breath sounds checked- equal and bilateral Tube secured with: Tape Dental Injury: Teeth and Oropharynx as per pre-operative assessment
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
## 2019-11-13 ENCOUNTER — Ambulatory Visit (LOCAL_COMMUNITY_HEALTH_CENTER): Payer: Self-pay

## 2019-11-13 ENCOUNTER — Other Ambulatory Visit: Payer: Self-pay

## 2019-11-13 DIAGNOSIS — Z23 Encounter for immunization: Secondary | ICD-10-CM

## 2019-11-27 ENCOUNTER — Other Ambulatory Visit: Payer: Self-pay | Admitting: Internal Medicine

## 2019-11-27 DIAGNOSIS — Z1231 Encounter for screening mammogram for malignant neoplasm of breast: Secondary | ICD-10-CM

## 2019-12-11 ENCOUNTER — Other Ambulatory Visit: Payer: Self-pay | Admitting: Internal Medicine

## 2019-12-11 DIAGNOSIS — M810 Age-related osteoporosis without current pathological fracture: Secondary | ICD-10-CM

## 2019-12-12 ENCOUNTER — Other Ambulatory Visit: Payer: Self-pay

## 2019-12-12 ENCOUNTER — Ambulatory Visit
Admission: RE | Admit: 2019-12-12 | Discharge: 2019-12-12 | Disposition: A | Discharge status: Home / Self Care | Payer: Self-pay | Source: Ambulatory Visit | Attending: Internal Medicine | Admitting: Internal Medicine

## 2019-12-12 DIAGNOSIS — Z1231 Encounter for screening mammogram for malignant neoplasm of breast: Secondary | ICD-10-CM | POA: Insufficient documentation

## 2020-01-23 ENCOUNTER — Ambulatory Visit
Admission: RE | Admit: 2020-01-23 | Discharge: 2020-01-23 | Disposition: A | Discharge status: Home / Self Care | Payer: Self-pay | Source: Ambulatory Visit | Attending: Internal Medicine | Admitting: Internal Medicine

## 2020-01-23 ENCOUNTER — Other Ambulatory Visit: Payer: Self-pay

## 2020-01-23 DIAGNOSIS — M810 Age-related osteoporosis without current pathological fracture: Secondary | ICD-10-CM | POA: Insufficient documentation

## 2020-08-05 ENCOUNTER — Other Ambulatory Visit: Payer: Self-pay

## 2020-08-05 ENCOUNTER — Ambulatory Visit (LOCAL_COMMUNITY_HEALTH_CENTER): Payer: Self-pay

## 2020-08-05 DIAGNOSIS — Z23 Encounter for immunization: Secondary | ICD-10-CM

## 2020-08-05 NOTE — Progress Notes (Signed)
Tolerated Hep A #2 well today. Updated [facility] copy given and explained. Josie Saunders, RN

## 2020-11-27 ENCOUNTER — Other Ambulatory Visit: Payer: Self-pay | Admitting: Internal Medicine

## 2020-11-27 DIAGNOSIS — Z1231 Encounter for screening mammogram for malignant neoplasm of breast: Secondary | ICD-10-CM

## 2020-12-12 ENCOUNTER — Other Ambulatory Visit: Payer: Self-pay

## 2020-12-12 ENCOUNTER — Ambulatory Visit
Admission: RE | Admit: 2020-12-12 | Discharge: 2020-12-12 | Disposition: A | Discharge status: Home / Self Care | Payer: Self-pay | Source: Ambulatory Visit | Attending: Internal Medicine | Admitting: Internal Medicine

## 2020-12-12 DIAGNOSIS — Z1231 Encounter for screening mammogram for malignant neoplasm of breast: Secondary | ICD-10-CM | POA: Insufficient documentation

## 2021-04-29 ENCOUNTER — Ambulatory Visit (INDEPENDENT_AMBULATORY_CARE_PROVIDER_SITE_OTHER): Payer: Self-pay | Admitting: Dermatology

## 2021-04-29 ENCOUNTER — Other Ambulatory Visit: Payer: Self-pay

## 2021-04-29 ENCOUNTER — Encounter: Payer: Self-pay | Admitting: Dermatology

## 2021-04-29 DIAGNOSIS — D1801 Hemangioma of skin and subcutaneous tissue: Secondary | ICD-10-CM

## 2021-04-29 DIAGNOSIS — D2372 Other benign neoplasm of skin of left lower limb, including hip: Secondary | ICD-10-CM

## 2021-04-29 DIAGNOSIS — L814 Other melanin hyperpigmentation: Secondary | ICD-10-CM

## 2021-04-29 DIAGNOSIS — Z1283 Encounter for screening for malignant neoplasm of skin: Secondary | ICD-10-CM

## 2021-04-29 DIAGNOSIS — L309 Dermatitis, unspecified: Secondary | ICD-10-CM

## 2021-04-29 DIAGNOSIS — L821 Other seborrheic keratosis: Secondary | ICD-10-CM

## 2021-04-29 DIAGNOSIS — D492 Neoplasm of unspecified behavior of bone, soft tissue, and skin: Secondary | ICD-10-CM

## 2021-04-29 DIAGNOSIS — L738 Other specified follicular disorders: Secondary | ICD-10-CM

## 2021-04-29 DIAGNOSIS — D229 Melanocytic nevi, unspecified: Secondary | ICD-10-CM

## 2021-04-29 DIAGNOSIS — L578 Other skin changes due to chronic exposure to nonionizing radiation: Secondary | ICD-10-CM

## 2021-04-29 MED ORDER — HYDROCORTISONE 2.5 % EX CREA: TOPICAL_CREAM | CUTANEOUS | 2 refills | Status: AC

## 2021-04-29 NOTE — Patient Instructions (Addendum)
Melanoma ABCDEs  Melanoma is the most dangerous type of skin cancer, and is the leading cause of death from skin disease.  You are more likely to develop melanoma if you: Have light-colored skin, light-colored eyes, or red or blond hair Spend a lot of time in the sun Tan regularly, either outdoors or in a tanning bed Have had blistering sunburns, especially during childhood Have a close family member who has had a melanoma Have atypical moles or large birthmarks  Early detection of melanoma is key since treatment is typically straightforward and cure rates are extremely high if we catch it early.   The first sign of melanoma is often a change in a mole or a new dark spot.  The ABCDE system is a way of remembering the signs of melanoma.  A for asymmetry:  The two halves do not match. B for border:  The edges of the growth are irregular. C for color:  A mixture of colors are present instead of an even brown color. D for diameter:  Melanomas are usually (but not always) greater than 63mm - the size of a pencil eraser. E for evolution:  The spot keeps changing in size, shape, and color.  Please check your skin once per month between visits. You can use a small mirror in front and a large mirror behind you to keep an eye on the back side or your body.   If you see any new or changing lesions before your next follow-up, please call to schedule a visit.  Please continue daily skin protection including broad spectrum sunscreen SPF 30+ to sun-exposed areas, reapplying every 2 hours as needed when you're outdoors.   Staying in the shade or wearing long sleeves, sun glasses (UVA+UVB protection) and wide brim hats (4-inch brim around the entire circumference of the hat) are also recommended for sun protection.    Recommend taking Heliocare sun protection supplement daily in sunny weather for additional sun protection. For maximum protection on the sunniest days, you can take up to 2 capsules of  regular Heliocare OR take 1 capsule of Heliocare Ultra. For prolonged exposure (such as a full day in the sun), you can repeat your dose of the supplement 4 hours after your first dose. Heliocare can be purchased at [facility] or at [email or web address].   Hydrocortisone 2.5% cream 1-2 times daily to affected areas on face for 1-2 weeks as needed only.  Topical steroids (such as triamcinolone, fluocinolone, fluocinonide, mometasone, clobetasol, halobetasol, betamethasone, hydrocortisone) can cause thinning and lightening of the skin if they are used for too long in the same area. Your physician has selected the right strength medicine for your problem and area affected on the body. Please use your medication only as directed by your physician to prevent side effects.    If You Need Anything After Your Visit  If you have any questions or concerns for your doctor, please call our main line at [phone] and press option 4 to reach your doctor's medical assistant. If no one answers, please leave a voicemail as directed and we will return your call as soon as possible. Messages left after 4 pm will be answered the following business day.   You may also send [place] a message via [place]. We typically respond to MyChart messages within 1-2 business days.  For prescription refills, please ask your pharmacy to contact our office. Our fax number is [phone].  If you have an urgent issue when the clinic is closed  that cannot wait until the next business day, you can page your doctor at the number below.    Please note that while we do our best to be available for urgent issues outside of office hours, we are not available 24/7.   If you have an urgent issue and are unable to reach [place], you may choose to seek medical care at your doctor's office, retail clinic, urgent care center, or emergency room.  If you have a medical emergency, please immediately call 911 or go to the emergency  department.  Pager Numbers  - Dr. Nehemiah Massed: [phone]  - Dr. Laurence Ferrari: [phone]  - Dr. Nicole Kindred: [phone]  In the event of inclement weather, please call our main line at [phone] for an update on the status of any delays or closures.  Dermatology Medication Tips: Please keep the boxes that topical medications come in in order to help keep track of the instructions about where and how to use these. Pharmacies typically print the medication instructions only on the boxes and not directly on the medication tubes.   If your medication is too expensive, please contact our office at [phone] option 4 or send [place] a message through [place].   We are unable to tell what your co-pay for medications will be in advance as this is different depending on your insurance coverage. However, we may be able to find a substitute medication at lower cost or fill out paperwork to get insurance to cover a needed medication.   If a prior authorization is required to get your medication covered by your insurance company, please allow [place] 1-2 business days to complete this process.  Drug prices often vary depending on where the prescription is filled and some pharmacies may offer cheaper prices.  The website www.goodrx.com contains coupons for medications through different pharmacies. The prices here do not account for what the cost may be with help from insurance (it may be cheaper with your insurance), but the website can give you the price if you did not use any insurance.  - You can print the associated coupon and take it with your prescription to the pharmacy.  - You may also stop by our office during regular business hours and pick up a GoodRx coupon card.  - If you need your prescription sent electronically to a different pharmacy, notify our office through [facility] or by phone at [phone] option 4.     Si Usted Necesita Algo Despus de Su Visita  Tambin puede enviarnos un  mensaje a travs de Pharmacist, community. Por lo general respondemos a los mensajes de MyChart en el transcurso de 1 a 2 das hbiles.  Para renovar recetas, por favor pida a su farmacia que se ponga en contacto con nuestra oficina. Harland Dingwall de fax es [place] [phone].  Si tiene un asunto urgente cuando la clnica est cerrada y que no puede esperar hasta el siguiente da hbil, puede llamar/localizar a su doctor(a) al nmero que aparece a continuacin.   Por favor, tenga en cuenta que aunque hacemos todo lo posible para estar disponibles para asuntos urgentes fuera del horario de [place], no estamos disponibles las 24 horas del da, los 7 das de la [place].   Si tiene un problema urgente y no puede comunicarse con nosotros, puede optar por buscar atencin mdica  en el consultorio de su doctor(a), en una clnica privada, en un centro de atencin urgente o en una sala de emergencias.  Si tiene [employer], por  favor llame inmediatamente al 911 o vaya a la sala de emergencias.  Nmeros de bper  - Dr. Nehemiah Massed: [phone]  - Dra. Moye: [phone]  - Dra. Nicole Kindred: [phone]  En caso de inclemencias del [place], por favor llame a Johnsie Kindred principal al [phone] para una actualizacin sobre el [place] de cualquier retraso o cierre.  Consejos para la medicacin en dermatologa: Por favor, guarde las cajas en las que vienen los medicamentos de uso tpico para ayudarle a seguir las instrucciones sobre dnde y cmo usarlos. Las farmacias generalmente imprimen las instrucciones del medicamento slo en las cajas y no directamente en los tubos del [place].   Si su medicamento es muy caro, por favor, pngase en contacto con Zigmund Daniel llamando al [phone] y presione la opcin 4 o envenos un mensaje a travs de Pharmacist, community.   No podemos decirle cul ser su copago por los medicamentos por adelantado ya que esto es diferente dependiendo de la cobertura de su seguro. Sin embargo,  es posible que podamos encontrar un medicamento sustituto a Electrical engineer un formulario para que el seguro cubra el medicamento que se considera necesario.   Si se requiere una autorizacin previa para que su compaa de seguros [place] su medicamento, por favor permtanos de 1 a 2 das hbiles para completar este proceso.  Los precios de los medicamentos varan con frecuencia dependiendo del Environmental consultant de dnde se surte la receta y alguna farmacias pueden ofrecer precios ms baratos.  El sitio web www.goodrx.com tiene cupones para medicamentos de Airline pilot. Los precios aqu no tienen en cuenta lo que podra costar con la ayuda del seguro (puede ser ms barato con su seguro), pero el sitio web puede darle el precio si no utiliz Research scientist (physical sciences).  - Puede imprimir el cupn correspondiente y llevarlo con su receta a la farmacia.  - Tambin puede pasar por nuestra oficina durante el horario de atencin regular y Charity fundraiser una tarjeta de cupones de GoodRx.  - Si necesita que su receta se enve electrnicamente a una farmacia diferente, informe a nuestra oficina a travs de MyChart de [place] o por telfono llamando al [phone] y presione la opcin 4.

## 2021-04-29 NOTE — Progress Notes (Signed)
New Patient Visit  Subjective  Megan Lin is a 61 y.o. female who presents for the following: Annual Exam (No hx of skin cancer. Concerned about freckle between toes on right foot. SK on right cheek ).  The patient presents for Total-Body Skin Exam (TBSE) for skin cancer screening and mole check.  The patient has spots, moles and lesions to be evaluated, some may be new or changing and the patient has concerns that these could be cancer.  Review of Systems: No other skin or systemic complaints except as noted in HPI or Assessment and Plan.   Objective  Well appearing patient in no apparent distress; mood and affect are within normal limits.  A full examination was performed including scalp, head, eyes, ears, nose, lips, neck, chest, axillae, abdomen, back, buttocks, bilateral upper extremities, bilateral lower extremities, hands, feet, fingers, toes, fingernails, and toenails. All findings within normal limits unless otherwise noted below.  Right Upper Arm - Anterior 0.9 cm pink papule without features suspicious for malignancy on dermoscopy   right cheek Small yellow papule with a central dell.   Right Forehead Scaly pink patch   Assessment & Plan  Neoplasm of skin Right Upper Arm - Anterior  Favor benign. Patient states she has only had this lesion ~1 week, started out like an "acne pimple". Call if not resolved in 4-6 weeks.   Sebaceous hyperplasia right cheek  Benign-appearing.  Observation.  Call clinic for new or changing lesions.    Dermatitis Right Forehead  Chronic condition with duration or expected duration over one year. Condition is bothersome to patient. Currently flared.  Present for years, more in winter  HC 2.5% cream 1-2 times daily for 1-2 weeks as needed only.  If flaring often or not clearing in 1-2 weeks, call for Rx of Pimecrolimus.   Topical steroids (such as triamcinolone, fluocinolone, fluocinonide, mometasone, clobetasol,  halobetasol, betamethasone, hydrocortisone) can cause thinning and lightening of the skin if they are used for too long in the same area. Your physician has selected the right strength medicine for your problem and area affected on the body. Please use your medication only as directed by your physician to prevent side effects.    hydrocortisone 2.5 % cream - Right Forehead Apply to affected areas on face 1-2 times daily for 1-2 weeks PRN only   Lentigines - Scattered tan macules - Due to sun exposure - Benign-appearing, observe - Recommend daily broad spectrum sunscreen SPF 30+ to sun-exposed areas, reapply every 2 hours as needed. - Call for any changes  Seborrheic Keratoses - Stuck-on, waxy, tan-brown papules and/or plaques  - Benign-appearing - Discussed benign etiology and prognosis. - Observe - Call for any changes  Melanocytic Nevi - Tan-brown and/or pink-flesh-colored symmetric macules and papules - Benign appearing on exam today - Observation - Call clinic for new or changing moles - Recommend daily use of broad spectrum spf 30+ sunscreen to sun-exposed areas.  -R-4 medial toe: regular brown macule without features suspicious for malignancy on dermoscopy   Hemangiomas - Red papules - Discussed benign nature - Observe - Call for any changes  Actinic Damage - Chronic condition, secondary to cumulative UV/sun exposure - diffuse scaly erythematous macules with underlying dyspigmentation - Recommend daily broad spectrum sunscreen SPF 30+ to sun-exposed areas, reapply every 2 hours as needed.  - Staying in the shade or wearing long sleeves, sun glasses (UVA+UVB protection) and wide brim hats (4-inch brim around the entire circumference of the hat) are  also recommended for sun protection.  - Call for new or changing lesions.  Skin cancer screening performed today.  Dermatofibroma - Firm pink/brown papulenodule with dimple sign at left lateral leg - Benign appearing -  Call for any changes   Return for TBSE in 1-2 years.  I, Emelia Salisbury, CMA, am acting as scribe for Forest Gleason, MD.  Documentation: I have reviewed the above documentation for accuracy and completeness, and I agree with the above.  Forest Gleason, MD

## 2021-05-06 ENCOUNTER — Encounter: Payer: Self-pay | Admitting: Dermatology

## 2021-05-26 ENCOUNTER — Encounter: Payer: Self-pay | Admitting: Ophthalmology

## 2021-06-08 NOTE — Discharge Instructions (Signed)

## 2021-06-10 ENCOUNTER — Encounter
Admission: RE | Disposition: A | Discharge status: Home / Self Care | Payer: Self-pay | Source: Home / Self Care | Attending: Ophthalmology

## 2021-06-10 ENCOUNTER — Ambulatory Visit: Payer: Self-pay | Admitting: Anesthesiology

## 2021-06-10 ENCOUNTER — Ambulatory Visit
Admission: RE | Admit: 2021-06-10 | Discharge: 2021-06-10 | Disposition: A | Discharge status: Home / Self Care | Payer: Self-pay | Attending: Ophthalmology | Admitting: Ophthalmology

## 2021-06-10 ENCOUNTER — Encounter: Payer: Self-pay | Admitting: Ophthalmology

## 2021-06-10 ENCOUNTER — Other Ambulatory Visit: Payer: Self-pay

## 2021-06-10 DIAGNOSIS — K219 Gastro-esophageal reflux disease without esophagitis: Secondary | ICD-10-CM | POA: Insufficient documentation

## 2021-06-10 DIAGNOSIS — I10 Essential (primary) hypertension: Secondary | ICD-10-CM | POA: Insufficient documentation

## 2021-06-10 DIAGNOSIS — H2512 Age-related nuclear cataract, left eye: Secondary | ICD-10-CM | POA: Insufficient documentation

## 2021-06-10 DIAGNOSIS — F419 Anxiety disorder, unspecified: Secondary | ICD-10-CM | POA: Insufficient documentation

## 2021-06-10 DIAGNOSIS — E669 Obesity, unspecified: Secondary | ICD-10-CM | POA: Insufficient documentation

## 2021-06-10 DIAGNOSIS — Z6824 Body mass index (BMI) 24.0-24.9, adult: Secondary | ICD-10-CM | POA: Insufficient documentation

## 2021-06-10 HISTORY — PX: CATARACT EXTRACTION W/PHACO: SHX586

## 2021-06-10 SURGERY — PHACOEMULSIFICATION, CATARACT, WITH IOL INSERTION
Anesthesia: Monitor Anesthesia Care | Site: Eye | Laterality: Left

## 2021-06-10 MED ORDER — FENTANYL CITRATE (PF) 100 MCG/2ML IJ SOLN
INTRAMUSCULAR | Status: DC | PRN
  Administered 2021-06-10: 50 ug via INTRAVENOUS

## 2021-06-10 MED ORDER — MIDAZOLAM HCL 2 MG/2ML IJ SOLN
INTRAMUSCULAR | Status: DC | PRN
  Administered 2021-06-10: 1 mg via INTRAVENOUS

## 2021-06-10 MED ORDER — ONDANSETRON HCL 4 MG/2ML IJ SOLN: 4.0000 mg | Freq: Once | INTRAMUSCULAR | Status: DC | PRN

## 2021-06-10 MED ORDER — ARMC OPHTHALMIC DILATING DROPS
1.0000 "application " | OPHTHALMIC | Status: DC | PRN
  Administered 2021-06-10 (×3): 1 via OPHTHALMIC

## 2021-06-10 MED ORDER — SIGHTPATH DOSE#1 BSS IO SOLN
INTRAOCULAR | Status: DC | PRN
  Administered 2021-06-10: 1 mL via INTRAMUSCULAR

## 2021-06-10 MED ORDER — LACTATED RINGERS IV SOLN: INTRAVENOUS | Status: DC

## 2021-06-10 MED ORDER — TETRACAINE HCL 0.5 % OP SOLN
1.0000 [drp] | OPHTHALMIC | Status: DC | PRN
  Administered 2021-06-10 (×3): 1 [drp] via OPHTHALMIC

## 2021-06-10 MED ORDER — BRIMONIDINE TARTRATE-TIMOLOL 0.2-0.5 % OP SOLN
OPHTHALMIC | Status: DC | PRN
  Administered 2021-06-10: 1 [drp] via OPHTHALMIC

## 2021-06-10 MED ORDER — SIGHTPATH DOSE#1 BSS IO SOLN
INTRAOCULAR | Status: DC | PRN
  Administered 2021-06-10: 15 mL

## 2021-06-10 MED ORDER — ACETAMINOPHEN 160 MG/5ML PO SOLN: 325.0000 mg | ORAL | Status: DC | PRN

## 2021-06-10 MED ORDER — ACETAMINOPHEN 325 MG PO TABS: 325.0000 mg | ORAL_TABLET | ORAL | Status: DC | PRN

## 2021-06-10 MED ORDER — SIGHTPATH DOSE#1 BSS IO SOLN
INTRAOCULAR | Status: DC | PRN
  Administered 2021-06-10: 79 mL via OPHTHALMIC

## 2021-06-10 MED ORDER — MOXIFLOXACIN HCL 0.5 % OP SOLN
OPHTHALMIC | Status: DC | PRN
  Administered 2021-06-10: 0.2 mL via OPHTHALMIC

## 2021-06-10 SURGICAL SUPPLY — 13 items
ACRYSOF IQ PANOPTIX TORIC 14.0 ×1 IMPLANT
CATARACT SUITE SIGHTPATH (MISCELLANEOUS) ×2 IMPLANT
FEE CATARACT SUITE SIGHTPATH (MISCELLANEOUS) ×1 IMPLANT
GLOVE SRG 8 PF TXTR STRL LF DI (GLOVE) ×1 IMPLANT
GLOVE SURG ENC TEXT LTX SZ7.5 (GLOVE) ×2 IMPLANT
GLOVE SURG UNDER POLY LF SZ8 (GLOVE) ×2
LENS IOL IQ PAN TRC 30 14.0 IMPLANT
LENS IOL IQ PANOPTIX TORIC 14 ×1 IMPLANT
NDL FILTER BLUNT 18X1 1/2 (NEEDLE) ×1 IMPLANT
NEEDLE FILTER BLUNT 18X 1/2SAF (NEEDLE) ×1
NEEDLE FILTER BLUNT 18X1 1/2 (NEEDLE) ×1 IMPLANT
SYR 3ML LL SCALE MARK (SYRINGE) ×2 IMPLANT
WATER STERILE IRR 250ML POUR (IV SOLUTION) ×2 IMPLANT

## 2021-06-10 NOTE — Anesthesia Preprocedure Evaluation (Signed)
Anesthesia Evaluation  Patient identified by MRN, date of birth, ID band Patient awake    Reviewed: Allergy & Precautions, NPO status , Patient's Chart, lab work & pertinent test results  History of Anesthesia Complications (+) Family history of anesthesia reaction  Airway Mallampati: II  TM Distance: >3 FB Neck ROM: Full    Dental no notable dental hx.    Pulmonary neg pulmonary ROS,    Pulmonary exam normal breath sounds clear to auscultation       Cardiovascular Exercise Tolerance: Good hypertension, Normal cardiovascular exam Rhythm:Regular Rate:Normal     Neuro/Psych Anxiety negative neurological ROS     GI/Hepatic Neg liver ROS, GERD  ,  Endo/Other  negative endocrine ROS  Renal/GU negative Renal ROS  negative genitourinary   Musculoskeletal negative musculoskeletal ROS (+)   Abdominal Normal abdominal exam  (+) - obese,  Abdomen: soft.    Peds negative pediatric ROS (+)  Hematology negative hematology ROS (+)   Anesthesia Other Findings   Reproductive/Obstetrics negative OB ROS                             Anesthesia Physical Anesthesia Plan  ASA: 2  Anesthesia Plan: MAC   Post-op Pain Management:    Induction: Intravenous  PONV Risk Score and Plan: Treatment may vary due to age or medical condition and Midazolam  Airway Management Planned: Natural Airway and Nasal Cannula  Additional Equipment:   Intra-op Plan:   Post-operative Plan:   Informed Consent: I have reviewed the patients History and Physical, chart, labs and discussed the procedure including the risks, benefits and alternatives for the proposed anesthesia with the patient or authorized representative who has indicated his/her understanding and acceptance.     Dental advisory given  Plan Discussed with: CRNA  Anesthesia Plan Comments:         Anesthesia Quick Evaluation  There are no  problems to display for this patient.   CBC Latest Ref Rng & Units 09/17/2019  WBC 4.0 - 10.5 K/uL 4.5  Hemoglobin 12.0 - 15.0 g/dL 13.3  Hematocrit 36.0 - 46.0 % 39.5  Platelets 150 - 400 K/uL 280   BMP Latest Ref Rng & Units 09/17/2019  Glucose 70 - 99 mg/dL 90  BUN 6 - 20 mg/dL 13  Creatinine 0.44 - 1.00 mg/dL 0.70  Sodium 135 - 145 mmol/L 137  Potassium 3.5 - 5.1 mmol/L 3.5  Chloride 98 - 111 mmol/L 101  CO2 22 - 32 mmol/L 27  Calcium 8.9 - 10.3 mg/dL 9.1    Risks and benefits of anesthesia discussed at length, patient or surrogate demonstrates understanding. Appropriately NPO. Plan to proceed with anesthesia.  Champ Mungo, MD 06/10/21

## 2021-06-10 NOTE — Op Note (Signed)
°  LOCATION:  [place]   PREOPERATIVE DIAGNOSIS:  Nuclear sclerotic cataract of the left eye.  H25.12  POSTOPERATIVE DIAGNOSIS:  Nuclear sclerotic cataract of the left eye.   PROCEDURE:  Phacoemulsification with Toric posterior chamber intraocular lens placement of the left eye.  Ultrasound time: Procedure(s) with comments: CATARACT EXTRACTION PHACO AND INTRAOCULAR LENS PLACEMENT (IOC) LEFT PANOPTIX TORIC (Left) - 8.77 01:18.3  LENS:   Implant Name Type Inv. Item Serial No. Manufacturer Lot No. LRB No. Used Action  ACRYSOF IQ PANOPTIX TORIC 14.0 - [id number]  ACRYSOF IQ PANOPTIX TORIC 14.0 [id number] SIGHT[employer]  Left 1 Implanted     TFNT30 14.0 Panoptix Toric intraocular lens with 1.5 diopters of cylindrical power with axis orientation at 80 degrees.     SURGEON:  Wyonia Hough, MD   ANESTHESIA:  Topical with tetracaine drops and 2% Xylocaine jelly, augmented with 1% preservative-free intracameral lidocaine.  COMPLICATIONS:  None.   DESCRIPTION OF PROCEDURE:  The patient was identified in the holding room and transported to the operating suite and placed in the supine position under the operating microscope.  The left eye was identified as the operative eye, and it was prepped and draped in the usual sterile ophthalmic fashion.    A clear-corneal paracentesis incision was made at the 1:30 position.  0.5 ml of preservative-free 1% lidocaine was injected into the anterior chamber. The anterior chamber was filled with Viscoat.  A 2.4 millimeter near clear corneal incision was then made at the 10:30 position.  A cystotome and capsulorrhexis forceps were then used to make a curvilinear capsulorrhexis.  Hydrodissection and hydrodelineation were then performed using balanced salt solution.   Phacoemulsification was then used in stop and chop fashion to remove the lens, nucleus and epinucleus.  The remaining cortex was aspirated using the irrigation and aspiration  handpiece.  Provisc viscoelastic was then placed into the capsular bag to distend it for lens placement.  The Verion digital marker was used to align the implant at the intended axis.   A Toric lens was then injected into the capsular bag.  It was rotated clockwise until the axis marks on the lens were approximately 15 degrees in the counterclockwise direction to the intended alignment.  The viscoelastic was aspirated from the eye using the irrigation aspiration handpiece.  Then, a Koch spatula through the sideport incision was used to rotate the lens in a clockwise direction until the axis markings of the intraocular lens were lined up with the Verion alignment.  Balanced salt solution was then used to hydrate the wounds. Vigamox 0.2 ml of a 1mg  per ml solution was injected into the anterior chamber for a dose of 0.2 mg of intracameral antibiotic at the completion of the case.    The eye was noted to have a physiologic pressure and there was no wound leak noted.   Timolol and Brimonidine drops were applied to the eye.  The patient was taken to the recovery room in stable condition having had no complications of anesthesia or surgery.  Kasi Lasky 06/10/2021, 11:21 AM

## 2021-06-10 NOTE — Anesthesia Postprocedure Evaluation (Signed)
Anesthesia Post Note  Patient: Megan Lin  Procedure(s) Performed: CATARACT EXTRACTION PHACO AND INTRAOCULAR LENS PLACEMENT (I[facility]) LEFT PANOPTIX TORIC (Left: Eye)    Patient location during evaluation: PACU Anesthesia Type: MAC Level of consciousness: awake and alert Pain management: pain level controlled Vital Signs Assessment: post-procedure vital signs reviewed and stable Respiratory status: spontaneous breathing, nonlabored ventilation, respiratory function stable and patient connected to nasal cannula oxygen Cardiovascular status: stable and blood pressure returned to baseline Postop Assessment: no apparent nausea or vomiting Anesthetic complications: no   No notable events documented.  Sinda Du

## 2021-06-10 NOTE — Transfer of Care (Signed)
Immediate Anesthesia Transfer of Care Note  Patient: Megan Lin  Procedure(s) Performed: CATARACT EXTRACTION PHACO AND INTRAOCULAR LENS PLACEMENT (I[facility]) LEFT PANOPTIX TORIC (Left: Eye)  Patient Location: PACU  Anesthesia Type: MAC  Level of Consciousness: awake, alert  and patient cooperative  Airway and Oxygen Therapy: Patient Spontanous Breathing and Patient connected to supplemental oxygen  Post-op Assessment: Post-op Vital signs reviewed, Patient's Cardiovascular Status Stable, Respiratory Function Stable, Patent Airway and No signs of Nausea or vomiting  Post-op Vital Signs: Reviewed and stable  Complications: No notable events documented.

## 2021-06-10 NOTE — Anesthesia Procedure Notes (Signed)
Procedure Name: MAC Date/Time: 06/10/2021 11:00 AM Performed by: Dionne Bucy, CRNA Pre-anesthesia Checklist: Patient identified, Emergency Drugs available, Suction available, Patient being monitored and Timeout performed Patient Re-evaluated:Patient Re-evaluated prior to induction Oxygen Delivery Method: Nasal cannula Placement Confirmation: positive ETCO2

## 2021-06-10 NOTE — H&P (Signed)
[facility]   Primary Care Physician:  Adin Hector, MD Ophthalmologist: Dr. Leandrew Koyanagi  Pre-Procedure History & Physical: HPI:  Megan Lin is a 61 y.o. female here for ophthalmic surgery.   Past Medical History:  Diagnosis Date   Anxiety    Family history of adverse reaction to anesthesia    Maternal first cousin would not wake up.   GERD (gastroesophageal reflux disease)    Hyperlipidemia    Hypertension    Leukopenia    Osteoporosis    Scoliosis     Past Surgical History:  Procedure Laterality Date   COLONOSCOPY WITH PROPOFOL     ESOPHAGOGASTRODUODENOSCOPY (EGD) WITH PROPOFOL N/A 12/12/2014   Procedure: ESOPHAGOGASTRODUODENOSCOPY (EGD) WITH PROPOFOL;  Surgeon: Manya Silvas, MD;  Location: [place];  Service: Endoscopy;  Laterality: N/A;   HYSTEROSCOPY DIAGNOSTIC     KNEE ARTHROSCOPY WITH LATERAL MENISECTOMY Left 09/19/2019   Procedure: Left knee arthroscopy with debridement, abrasion chondroplasty of the patella, excision of symptomatic plica, and repair versus partial lateral meniscectomy.;  Surgeon: Corky Mull, MD;  Location: [facility];  Service: Orthopedics;  Laterality: Left;    Prior to Admission medications   Medication Sig Start Date End Date Taking? Authorizing Provider  calcium carbonate (OS-CAL) 600 MG TABS tablet Take 600 mg by mouth 2 (two) times daily with a meal.   Yes [provider]  chlorthalidone (HYGROTON) 25 MG tablet Take 25 mg by mouth daily. 10/02/18 06/10/21 Yes [provider]  cholecalciferol (VITAMIN D3) 25 MCG (1000 UNIT) tablet Take 1,000 Units by mouth daily.   Yes [provider]  Glucosamine-Chondroit-Vit C-Mn (GLUCOSAMINE 1500 COMPLEX PO) Take 1,500 mg by mouth daily.   Yes [provider]  ibuprofen (ADVIL) 600 MG tablet Take 600 mg by mouth every 6 (six) hours as needed.   Yes [provider]  omeprazole (PRILOSEC) 20 MG capsule Take 20 mg by mouth daily.   Yes  [provider]  polycarbophil (FIBERCON) 625 MG tablet Take 625 mg by mouth daily.   Yes [provider]  sertraline (ZOLOFT) 100 MG tablet Take 100 mg by mouth daily.    Yes [provider]  acetaminophen (TYLENOL) 500 MG tablet Take 500 mg by mouth every 6 (six) hours as needed. Patient not taking: Reported on 11/13/2019    [provider]  HYDROcodone-acetaminophen (NORCO) 5-325 MG tablet Take 1-2 tablets by mouth every 6 (six) hours as needed for moderate pain or severe pain. MAXIMUM TOTAL ACETAMINOPHEN DOSE IS 4000 MG PER DAY Patient not taking: Reported on 11/13/2019 09/19/19   Poggi, Marshall Cork, MD  hydrocortisone 2.5 % cream Apply to affected areas on face 1-2 times daily for 1-2 weeks PRN only 04/29/21   Moye, [place], MD    Allergies as of 03/18/2021 - Review Complete 08/05/2020  Allergen Reaction Noted   Sulfa antibiotics Other (See Comments) 12/12/2014   Penicillins Rash 12/11/2014    Family History  Problem Relation Age of Onset   Breast cancer Cousin     Social History   Socioeconomic History   Marital status: Married    Spouse name: Not on file   Number of children: Not on file   Years of education: Not on file   Highest education level: Not on file  Occupational History   Not on file  Tobacco Use   Smoking status: Never   Smokeless tobacco: Never  Vaping Use   Vaping Use: Never used  Substance  and Sexual Activity   Alcohol use: Yes    Alcohol/week: 0.0 - 1.0 standard drinks   Drug use: No   Sexual activity: Not on file  Other Topics Concern   Not on file  Social History Narrative   Not on file   Social Determinants of Health   Financial Resource Strain: Not on file  Food Insecurity: Not on file  Transportation Needs: Not on file  Physical Activity: Not on file  Stress: Not on file  Social Connections: Not on file  Intimate Partner Violence: Not on file    Review of Systems: See HPI, otherwise negative  ROS  Physical Exam: BP (!) 142/87    Pulse 65    Temp 99 F (37.2 C) (Temporal)    Ht 5\' 2"  (1.575 m)    Wt 61.4 kg    SpO2 100%    BMI 24.75 kg/m  General:   Alert,  pleasant and cooperative in NAD Head:  Normocephalic and atraumatic. Lungs:  Clear to auscultation.    Heart:  Regular rate and rhythm.   Impression/Plan: Megan Lin is here for ophthalmic surgery.  Risks, benefits, limitations, and alternatives regarding ophthalmic surgery have been reviewed with the patient.  Questions have been answered.  All parties agreeable.   Leandrew Koyanagi, MD  06/10/2021, 10:32 AM

## 2021-06-11 ENCOUNTER — Other Ambulatory Visit: Payer: Self-pay

## 2021-06-11 ENCOUNTER — Encounter: Payer: Self-pay | Admitting: Ophthalmology

## 2021-06-22 NOTE — Discharge Instructions (Signed)

## 2021-06-24 ENCOUNTER — Encounter: Payer: Self-pay | Admitting: Ophthalmology

## 2021-06-24 ENCOUNTER — Ambulatory Visit: Payer: Self-pay | Admitting: Anesthesiology

## 2021-06-24 ENCOUNTER — Ambulatory Visit
Admission: RE | Admit: 2021-06-24 | Discharge: 2021-06-24 | Disposition: A | Discharge status: Home / Self Care | Payer: Self-pay | Attending: Ophthalmology | Admitting: Ophthalmology

## 2021-06-24 ENCOUNTER — Encounter
Admission: RE | Disposition: A | Discharge status: Home / Self Care | Payer: Self-pay | Source: Home / Self Care | Attending: Ophthalmology

## 2021-06-24 ENCOUNTER — Other Ambulatory Visit: Payer: Self-pay

## 2021-06-24 DIAGNOSIS — I10 Essential (primary) hypertension: Secondary | ICD-10-CM | POA: Insufficient documentation

## 2021-06-24 DIAGNOSIS — F419 Anxiety disorder, unspecified: Secondary | ICD-10-CM | POA: Insufficient documentation

## 2021-06-24 DIAGNOSIS — H2511 Age-related nuclear cataract, right eye: Secondary | ICD-10-CM | POA: Insufficient documentation

## 2021-06-24 DIAGNOSIS — K219 Gastro-esophageal reflux disease without esophagitis: Secondary | ICD-10-CM | POA: Insufficient documentation

## 2021-06-24 HISTORY — PX: CATARACT EXTRACTION W/PHACO: SHX586

## 2021-06-24 SURGERY — PHACOEMULSIFICATION, CATARACT, WITH IOL INSERTION
Anesthesia: Monitor Anesthesia Care | Laterality: Right

## 2021-06-24 MED ORDER — ACETAMINOPHEN 10 MG/ML IV SOLN: 1000.0000 mg | Freq: Once | INTRAVENOUS | Status: DC | PRN

## 2021-06-24 MED ORDER — LACTATED RINGERS IV SOLN: INTRAVENOUS | Status: DC

## 2021-06-24 MED ORDER — SIGHTPATH DOSE#1 BSS IO SOLN
INTRAOCULAR | Status: DC | PRN
  Administered 2021-06-24: 45 mL via OPHTHALMIC

## 2021-06-24 MED ORDER — ONDANSETRON HCL 4 MG/2ML IJ SOLN: 4.0000 mg | Freq: Once | INTRAMUSCULAR | Status: DC | PRN

## 2021-06-24 MED ORDER — BRIMONIDINE TARTRATE-TIMOLOL 0.2-0.5 % OP SOLN
OPHTHALMIC | Status: DC | PRN
  Administered 2021-06-24: 1 [drp] via OPHTHALMIC

## 2021-06-24 MED ORDER — MIDAZOLAM HCL 2 MG/2ML IJ SOLN
INTRAMUSCULAR | Status: DC | PRN
  Administered 2021-06-24: 1 mg via INTRAVENOUS

## 2021-06-24 MED ORDER — TETRACAINE HCL 0.5 % OP SOLN
1.0000 [drp] | OPHTHALMIC | Status: DC | PRN
  Administered 2021-06-24 (×3): 1 [drp] via OPHTHALMIC

## 2021-06-24 MED ORDER — SIGHTPATH DOSE#1 BSS IO SOLN
INTRAOCULAR | Status: DC | PRN
  Administered 2021-06-24: 1 mL via INTRAMUSCULAR

## 2021-06-24 MED ORDER — MOXIFLOXACIN HCL 0.5 % OP SOLN
OPHTHALMIC | Status: DC | PRN
  Administered 2021-06-24: 0.2 mL via OPHTHALMIC

## 2021-06-24 MED ORDER — SIGHTPATH DOSE#1 BSS IO SOLN
INTRAOCULAR | Status: DC | PRN
  Administered 2021-06-24: 15 mL

## 2021-06-24 MED ORDER — SIGHTPATH DOSE#1 NA HYALUR & NA CHOND-NA HYALUR IO KIT
PACK | INTRAOCULAR | Status: DC | PRN
  Administered 2021-06-24: 1 via OPHTHALMIC

## 2021-06-24 MED ORDER — ARMC OPHTHALMIC DILATING DROPS
1.0000 "application " | OPHTHALMIC | Status: DC | PRN
  Administered 2021-06-24 (×3): 1 via OPHTHALMIC

## 2021-06-24 MED ORDER — FENTANYL CITRATE (PF) 100 MCG/2ML IJ SOLN
INTRAMUSCULAR | Status: DC | PRN
  Administered 2021-06-24: 50 ug via INTRAVENOUS

## 2021-06-24 SURGICAL SUPPLY — 11 items
CATARACT SUITE SIGHTPATH (MISCELLANEOUS) ×2 IMPLANT
FEE CATARACT SUITE SIGHTPATH (MISCELLANEOUS) ×1 IMPLANT
GLOVE SRG 8 PF TXTR STRL LF DI (GLOVE) ×1 IMPLANT
GLOVE SURG ENC TEXT LTX SZ7.5 (GLOVE) ×2 IMPLANT
GLOVE SURG UNDER POLY LF SZ8 (GLOVE) ×2
LENS IOL IQ PANOPTIX TORIC 12 ×1 IMPLANT
NDL FILTER BLUNT 18X1 1/2 (NEEDLE) ×1 IMPLANT
NEEDLE FILTER BLUNT 18X 1/2SAF (NEEDLE) ×1
NEEDLE FILTER BLUNT 18X1 1/2 (NEEDLE) ×1 IMPLANT
SYR 3ML LL SCALE MARK (SYRINGE) ×2 IMPLANT
WATER STERILE IRR 250ML POUR (IV SOLUTION) ×2 IMPLANT

## 2021-06-24 NOTE — H&P (Signed)
?[facility]  ? ?Primary Care Physician:  Adin Hector, MD ?Ophthalmologist: Dr. Leandrew Koyanagi ? ?Pre-Procedure History & Physical: ?HPI:  Megan Lin is a 61 y.o. female here for ophthalmic surgery. ?  ?Past Medical History:  ?Diagnosis Date  ? Anxiety   ? Family history of adverse reaction to anesthesia   ? Maternal first cousin would not wake up.  ? GERD (gastroesophageal reflux disease)   ? Hyperlipidemia   ? Hypertension   ? Leukopenia   ? Osteoporosis   ? Scoliosis   ? ? ?Past Surgical History:  ?Procedure Laterality Date  ? CATARACT EXTRACTION W/PHACO Left 06/10/2021  ? Procedure: CATARACT EXTRACTION PHACO AND INTRAOCULAR LENS PLACEMENT ([place]) LEFT PANOPTIX TORIC;  Surgeon: Leandrew Koyanagi, MD;  Location: [place];  Service: Ophthalmology;  Laterality: Left;  8.77 ?01:18.3  ? COLONOSCOPY WITH PROPOFOL    ? ESOPHAGOGASTRODUODENOSCOPY (EGD) WITH PROPOFOL N/A 12/12/2014  ? Procedure: ESOPHAGOGASTRODUODENOSCOPY (EGD) WITH PROPOFOL;  Surgeon: Manya Silvas, MD;  Location: [facility];  Service: Endoscopy;  Laterality: N/A;  ? HYSTEROSCOPY DIAGNOSTIC    ? KNEE ARTHROSCOPY WITH LATERAL MENISECTOMY Left 09/19/2019  ? Procedure: Left knee arthroscopy with debridement, abrasion chondroplasty of the patella, excision of symptomatic plica, and repair versus partial lateral meniscectomy.;  Surgeon: Corky Mull, MD;  Location: [facility];  Service: Orthopedics;  Laterality: Left;  ? ? ?Prior to Admission medications   ?Medication Sig Start Date End Date Taking? Authorizing Provider  ?acetaminophen (TYLENOL) 500 MG tablet Take 500 mg by mouth every 6 (six) hours as needed.   Yes [provider]  ?chlorthalidone (HYGROTON) 25 MG tablet Take 25 mg by mouth daily. 10/02/18 06/24/21 Yes [provider]  ?cholecalciferol (VITAMIN D3) 25 MCG (1000 UNIT) tablet Take 1,000 Units by mouth daily.   Yes [provider]  ?Glucosamine-Chondroit-Vit C-Mn (GLUCOSAMINE 1500  COMPLEX PO) Take 1,500 mg by mouth daily.   Yes [provider]  ?hydrocortisone 2.5 % cream Apply to affected areas on face 1-2 times daily for 1-2 weeks PRN only 04/29/21  Yes Moye, [place], MD  ?ibuprofen (ADVIL) 600 MG tablet Take 600 mg by mouth every 6 (six) hours as needed.   Yes [provider]  ?omeprazole (PRILOSEC) 20 MG capsule Take 20 mg by mouth daily.   Yes [provider]  ?polycarbophil (FIBERCON) 625 MG tablet Take 625 mg by mouth daily.   Yes [provider]  ?sertraline (ZOLOFT) 100 MG tablet Take 100 mg by mouth daily.    Yes [provider]  ?calcium carbonate (OS-CAL) 600 MG TABS tablet Take 600 mg by mouth 2 (two) times daily with a meal.    [provider]  ?HYDROcodone-acetaminophen (NORCO) 5-325 MG tablet Take 1-2 tablets by mouth every 6 (six) hours as needed for moderate pain or severe pain. MAXIMUM TOTAL ACETAMINOPHEN DOSE IS 4000 MG PER DAY ?Patient not taking: Reported on 11/13/2019 09/19/19   Poggi, Marshall Cork, MD  ? ? ?Allergies as of 03/18/2021 - Review Complete 08/05/2020  ?Allergen Reaction Noted  ? Sulfa antibiotics Other (See Comments) 12/12/2014  ? Penicillins Rash 12/11/2014  ? ? ?Family History  ?Problem Relation Age of Onset  ? Breast cancer Cousin   ? ? ?Social History  ? ?Socioeconomic History  ? Marital status: Married  ?  Spouse name: Not on file  ? Number of children: Not on file  ? Years of education: Not on file  ? Highest education level: Not  on file  ?Occupational History  ? Not on file  ?Tobacco Use  ? Smoking status: Never  ? Smokeless tobacco: Never  ?Vaping Use  ? Vaping Use: Never used  ?Substance and Sexual Activity  ? Alcohol use: Yes  ?  Alcohol/week: 0.0 - 1.0 standard drinks  ? Drug use: No  ? Sexual activity: Not on file  ?Other Topics Concern  ? Not on file  ?Social History Narrative  ? Not on file  ? ?Social Determinants of Health  ? ?Financial Resource Strain: Not on file  ?Food Insecurity: Not on file   ?Transportation Needs: Not on file  ?Physical Activity: Not on file  ?Stress: Not on file  ?Social Connections: Not on file  ?Intimate Partner Violence: Not on file  ? ? ?Review of Systems: ?See HPI, otherwise negative ROS ? ?Physical Exam: ?There were no vitals taken for this visit. ?General:   Alert,  pleasant and cooperative in NAD ?Head:  Normocephalic and atraumatic. ?Lungs:  Clear to auscultation.    ?Heart:  Regular rate and rhythm.  ? ?Impression/Plan: ?Megan Lin is here for ophthalmic surgery. ? ?Risks, benefits, limitations, and alternatives regarding ophthalmic surgery have been reviewed with the patient.  Questions have been answered.  All parties agreeable. ? ? Leandrew Koyanagi, MD  06/24/2021, 10:01 AM ? ?

## 2021-06-24 NOTE — Transfer of Care (Signed)
Immediate Anesthesia Transfer of Care Note ? ?Patient: Megan Lin ? ?Procedure(s) Performed: CATARACT EXTRACTION PHACO AND INTRAOCULAR LENS PLACEMENT ([facility]) RIGHT PANOPTIX TORIC (Right) ? ?Patient Location: PACU ? ?Anesthesia Type: MAC ? ?Level of Consciousness: awake, alert  and patient cooperative ? ?Airway and Oxygen Therapy: Patient Spontanous Breathing and Patient connected to supplemental oxygen ? ?Post-op Assessment: Post-op Vital signs reviewed, Patient's Cardiovascular Status Stable, Respiratory Function Stable, Patent Airway and No signs of Nausea or vomiting ? ?Post-op Vital Signs: Reviewed and stable ? ?Complications: No notable events documented. ? ?

## 2021-06-24 NOTE — Anesthesia Postprocedure Evaluation (Signed)
Anesthesia Post Note ? ?Patient: Megan Lin ? ?Procedure(s) Performed: CATARACT EXTRACTION PHACO AND INTRAOCULAR LENS PLACEMENT (I[facility]) RIGHT PANOPTIX TORIC (Right) ? ? ?  ?Patient location during evaluation: PACU ?Anesthesia Type: MAC ?Level of consciousness: awake and alert ?Pain management: pain level controlled ?Vital Signs Assessment: post-procedure vital signs reviewed and stable ?Respiratory status: spontaneous breathing, nonlabored ventilation, respiratory function stable and patient connected to nasal cannula oxygen ?Cardiovascular status: stable and blood pressure returned to baseline ?Postop Assessment: no apparent nausea or vomiting ?Anesthetic complications: no ? ? ?No notable events documented. ? ?Quindell Shere A  Timmy Cleverly ? ? ? ? ? ?

## 2021-06-24 NOTE — Anesthesia Preprocedure Evaluation (Signed)
Anesthesia Evaluation  ?Patient identified by MRN, date of birth, ID band ?Patient awake ? ? ? ?Reviewed: ?Allergy & Precautions, NPO status , Patient's Chart, lab work & pertinent test results, reviewed documented beta blocker date and time  ? ?History of Anesthesia Complications ?(+) Family history of anesthesia reactionNegative for: history of anesthetic complications ? ?Airway ?Mallampati: III ? ?TM Distance: >3 FB ?Neck ROM: Full ? ? ? Dental ?no notable dental hx. ? ?  ?Pulmonary ? ?  ?Pulmonary exam normal ?breath sounds clear to auscultation ? ? ? ? ? ? Cardiovascular ?Exercise Tolerance: Good ?hypertension, (-) angina(-) DOE Normal cardiovascular exam ?Rhythm:Regular Rate:Normal ? ? ?  ?Neuro/Psych ?Anxiety   ? GI/Hepatic ?GERD  ,  ?Endo/Other  ? ? Renal/GU ?  ? ?  ?Musculoskeletal ? ?Scoliosis  ? Abdominal ?Normal abdominal exam  (+) - obese,  ?Abdomen: soft. ? ?  ?Peds ? Hematology ?  ?Anesthesia Other Findings ? ? Reproductive/Obstetrics ? ?  ? ? ? ? ? ? ? ? ? ? ? ? ? ?  ?  ? ? ? ? ? ? ? ? ?Anesthesia Physical ? ?Anesthesia Plan ? ?ASA: 2 ? ?Anesthesia Plan: MAC  ? ?Post-op Pain Management:   ? ?Induction: Intravenous ? ?PONV Risk Score and Plan: 2 and Treatment may vary due to age or medical condition and Midazolam ? ?Airway Management Planned: Natural Airway and Nasal Cannula ? ?Additional Equipment:  ? ?Intra-op Plan:  ? ?Post-operative Plan:  ? ?Informed Consent: I have reviewed the patients History and Physical, chart, labs and discussed the procedure including the risks, benefits and alternatives for the proposed anesthesia with the patient or authorized representative who has indicated his/her understanding and acceptance.  ? ? ? ?Dental advisory given ? ?Plan Discussed with: CRNA ? ?Anesthesia Plan Comments:   ? ? ? ? ? ? ?Anesthesia Quick Evaluation ? ?There are no problems to display for this patient. ? ? ?CBC Latest Ref Rng & Units 09/17/2019  ?WBC 4.0 - 10.5  K/uL 4.5  ?Hemoglobin 12.0 - 15.0 g/dL 13.3  ?Hematocrit 36.0 - 46.0 % 39.5  ?Platelets 150 - 400 K/uL 280  ? ?BMP Latest Ref Rng & Units 09/17/2019  ?Glucose 70 - 99 mg/dL 90  ?BUN 6 - 20 mg/dL 13  ?Creatinine 0.44 - 1.00 mg/dL 0.70  ?Sodium 135 - 145 mmol/L 137  ?Potassium 3.5 - 5.1 mmol/L 3.5  ?Chloride 98 - 111 mmol/L 101  ?CO2 22 - 32 mmol/L 27  ?Calcium 8.9 - 10.3 mg/dL 9.1  ? ? ?Risks and benefits of anesthesia discussed at length, patient or surrogate demonstrates understanding. Appropriately NPO. Plan to proceed with anesthesia. ? ?Champ Mungo, MD ?06/24/21 ? ? ?

## 2021-06-24 NOTE — Op Note (Signed)
LOCATION:  [place] ?  ?PREOPERATIVE DIAGNOSIS:  Nuclear sclerotic cataract of the right eye.  H25.11 ?  ?POSTOPERATIVE DIAGNOSIS:  Nuclear sclerotic cataract of the right eye. ?  ?PROCEDURE:  Phacoemulsification with Toric posterior chamber intraocular lens placement of the right eye. ? Ultrasound time: Procedure(s) with comments: ?CATARACT EXTRACTION PHACO AND INTRAOCULAR LENS PLACEMENT ([facility]) RIGHT PANOPTIX TORIC (Right) - 3.21 ?00:44.7 ? ?LENS:   ?Implant Name Type Inv. Item Serial No. Manufacturer Lot No. LRB No. Used Action  ?ACRYSOF IQ Minerva Areola   [id number] ALCON  Right 1 Implanted  ?   TFNT30 12.0 Panoptix Toric intraocular lens with 1.5 diopters of cylindrical power with axis orientation at 102 degrees. ?  ?SURGEON:  Wyonia Hough, MD ?  ?ANESTHESIA: Topical with tetracaine drops and 2% Xylocaine jelly, augmented with 1% preservative-free intracameral lidocaine. ?. ?  ?COMPLICATIONS:  None. ?  ?DESCRIPTION OF PROCEDURE:  The patient was identified in the holding room and transported to the operating suite and placed in the supine position under the operating microscope.  The right eye was identified as the operative eye, and it was prepped and draped in the usual sterile ophthalmic fashion.   ? ?A clear-corneal paracentesis incision was made at the 12:00 position.  0.5 ml of preservative-free 1% lidocaine was injected into the anterior chamber. ?The anterior chamber was filled with Viscoat.  A 2.4 millimeter near clear corneal incision was then made at the 9:00 position.  A cystotome and capsulorrhexis forceps were then used to make a curvilinear capsulorrhexis.  Hydrodissection and hydrodelineation were then performed using balanced salt solution. ?  ?Phacoemulsification was then used in stop and chop fashion to remove the lens, nucleus and epinucleus.  The remaining cortex was aspirated using the irrigation and aspiration handpiece.  Provisc viscoelastic was then placed into  the capsular bag to distend it for lens placement.  The Verion digital marker was used to align the implant at the intended axis. ?  ?A Toric lens was then injected into the capsular bag.  It was rotated clockwise until the axis marks on the lens were approximately 15 degrees in the counterclockwise direction to the intended alignment.  The viscoelastic was aspirated from the eye using the irrigation aspiration handpiece.  Then, a Koch spatula through the sideport incision was used to rotate the lens in a clockwise direction until the axis markings of the intraocular lens were lined up with the Verion alignment.  Balanced salt solution was then used to hydrate the wounds. Cefuroxime 0.1 ml of a 10mg /ml solution was injected into the anterior chamber for a dose of 1 mg of intracameral antibiotic at the completion of the case. ? ?  ?The eye was noted to have a physiologic pressure and there was no wound leak noted.   Timolol and Brimonidine drops were applied to the eye.  The patient was taken to the recovery room in stable condition having had no complications of anesthesia or surgery. ? ?Sarabeth Benton ?06/24/2021, 10:47 AM ? ?

## 2021-06-25 ENCOUNTER — Encounter: Payer: Self-pay | Admitting: Ophthalmology

## 2021-11-18 ENCOUNTER — Other Ambulatory Visit: Payer: Self-pay | Admitting: Obstetrics and Gynecology

## 2021-11-18 DIAGNOSIS — Z1231 Encounter for screening mammogram for malignant neoplasm of breast: Secondary | ICD-10-CM

## 2021-12-11 IMAGING — MG MM DIGITAL SCREENING BILAT W/ TOMO AND CAD
6 of 10 series · 6 of 30 positions shown · non-contrast
Comparison: Previous exam(s).

CLINICAL DATA: Screening.

EXAM:
DIGITAL SCREENING BILATERAL MAMMOGRAM WITH TOMOSYNTHESIS AND CAD
TECHNIQUE: Bilateral screening digital craniocaudal and mediolateral oblique
mammograms were obtained. Bilateral screening digital breast
tomosynthesis was performed. The images were evaluated with
computer-aided detection.

[R MLO synth-2D (1 of 2)]
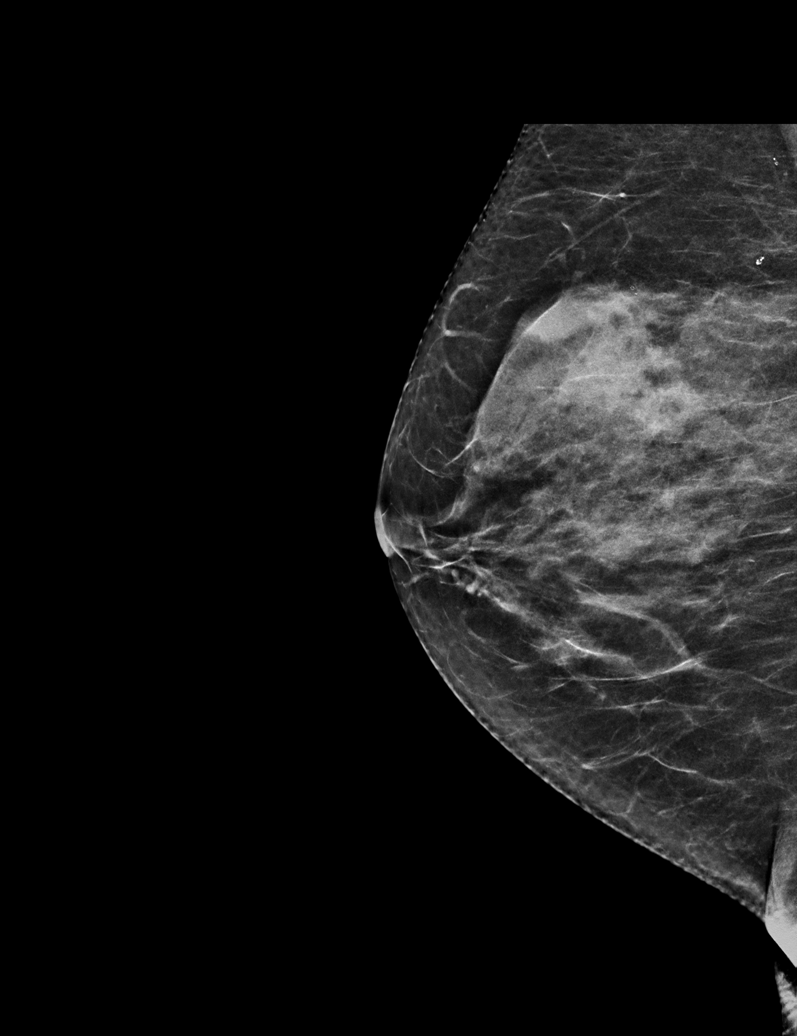

[L CC synth-2D]
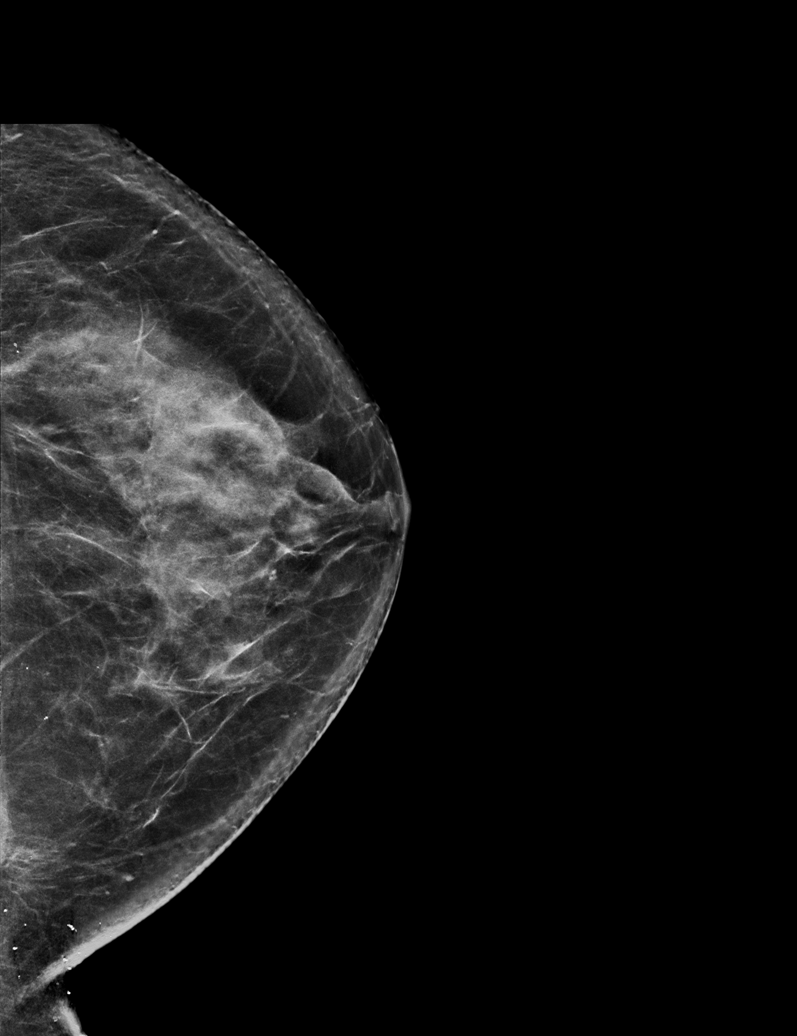

[R CC synth-2D]
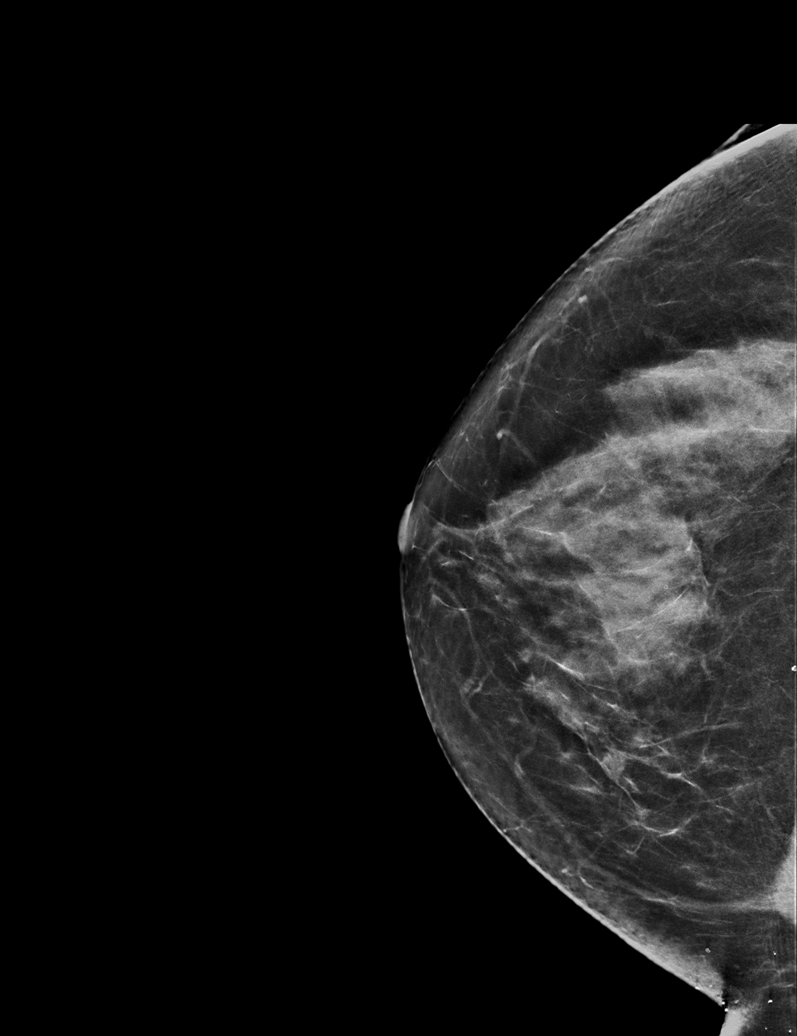

[R MLO synth-2D (2 of 2)]
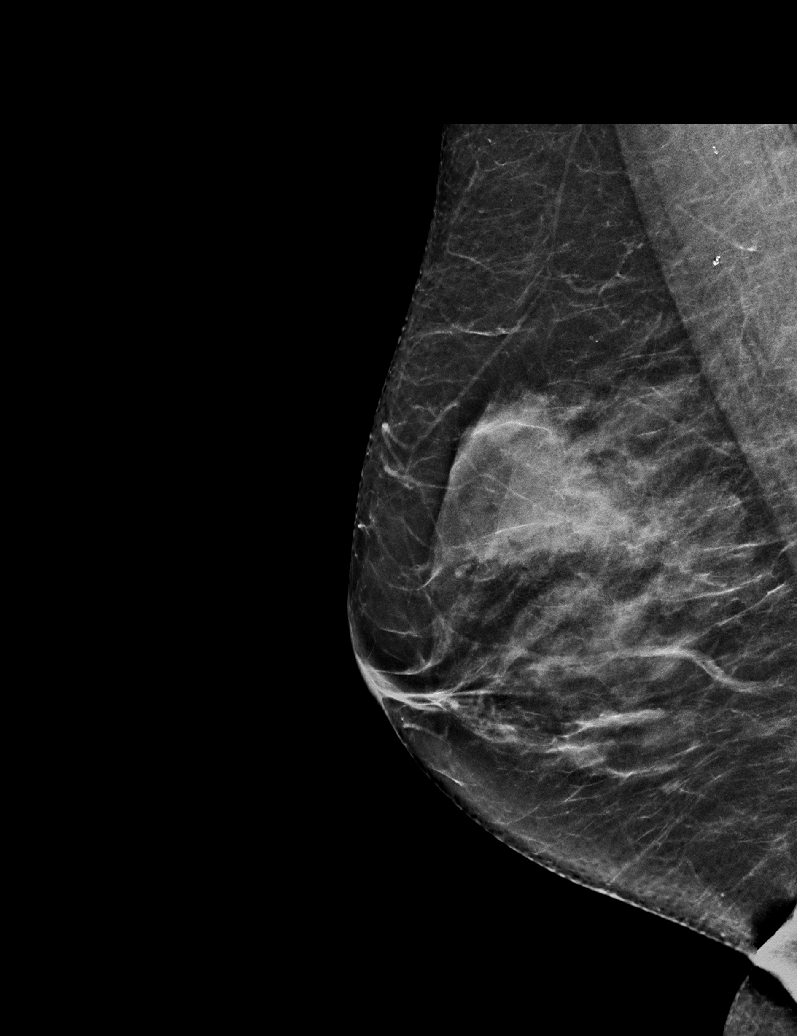

[L MLO synth-2D]
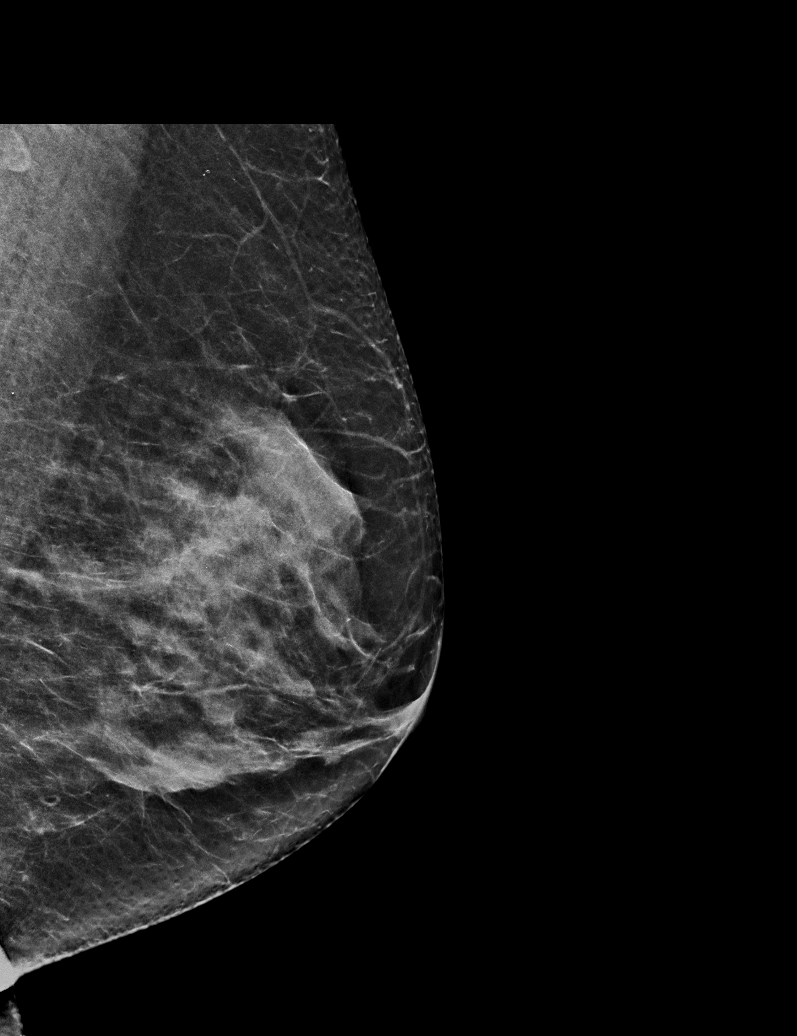

[L CC tomo · tomo slice 33/66.0]
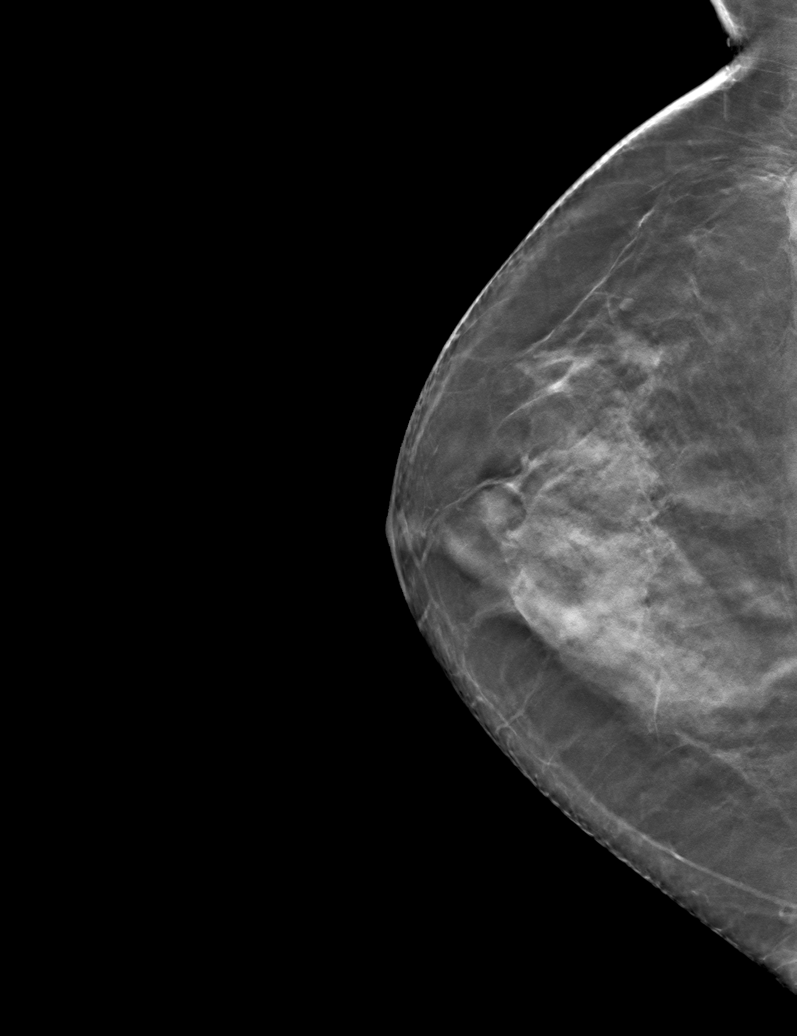

[6 of 30 positions shown; findings below may reference images not displayed]

ACR Breast Density Category c: The breast tissue is heterogeneously
dense, which may obscure small masses.
FINDINGS: There are no findings suspicious for malignancy.
IMPRESSION: No mammographic evidence of malignancy. A result letter of this
screening mammogram will be mailed directly to the patient.

RECOMMENDATION:
Screening mammogram in one year. (Code:Q3-W-BC3)

BI-RADS CATEGORY  1: Negative.

## 2021-12-14 ENCOUNTER — Ambulatory Visit
Admission: RE | Admit: 2021-12-14 | Discharge: 2021-12-14 | Disposition: A | Discharge status: Home / Self Care | Payer: Self-pay | Source: Ambulatory Visit | Attending: Obstetrics and Gynecology | Admitting: Obstetrics and Gynecology

## 2021-12-14 DIAGNOSIS — Z1231 Encounter for screening mammogram for malignant neoplasm of breast: Secondary | ICD-10-CM

## 2021-12-17 ENCOUNTER — Other Ambulatory Visit: Payer: Self-pay | Admitting: Obstetrics and Gynecology

## 2021-12-17 DIAGNOSIS — R928 Other abnormal and inconclusive findings on diagnostic imaging of breast: Secondary | ICD-10-CM

## 2021-12-17 DIAGNOSIS — N6489 Other specified disorders of breast: Secondary | ICD-10-CM

## 2022-01-01 ENCOUNTER — Ambulatory Visit
Admission: RE | Admit: 2022-01-01 | Discharge: 2022-01-01 | Disposition: A | Discharge status: Home / Self Care | Payer: Self-pay | Source: Ambulatory Visit | Attending: Obstetrics and Gynecology | Admitting: Obstetrics and Gynecology

## 2022-01-01 DIAGNOSIS — N6489 Other specified disorders of breast: Secondary | ICD-10-CM | POA: Insufficient documentation

## 2022-01-01 DIAGNOSIS — R928 Other abnormal and inconclusive findings on diagnostic imaging of breast: Secondary | ICD-10-CM | POA: Insufficient documentation

## 2022-01-04 ENCOUNTER — Other Ambulatory Visit: Payer: Self-pay | Admitting: Internal Medicine

## 2022-01-04 DIAGNOSIS — M810 Age-related osteoporosis without current pathological fracture: Secondary | ICD-10-CM

## 2022-02-12 ENCOUNTER — Ambulatory Visit
Admission: RE | Admit: 2022-02-12 | Discharge: 2022-02-12 | Disposition: A | Discharge status: Home / Self Care | Payer: Self-pay | Source: Ambulatory Visit | Attending: Internal Medicine | Admitting: Internal Medicine

## 2022-02-12 DIAGNOSIS — M810 Age-related osteoporosis without current pathological fracture: Secondary | ICD-10-CM | POA: Insufficient documentation

## 2022-03-03 ENCOUNTER — Other Ambulatory Visit: Payer: Self-pay

## 2022-07-15 ENCOUNTER — Ambulatory Visit (INDEPENDENT_AMBULATORY_CARE_PROVIDER_SITE_OTHER): Payer: Self-pay

## 2022-07-15 DIAGNOSIS — K5520 Angiodysplasia of colon without hemorrhage: Secondary | ICD-10-CM

## 2022-07-15 DIAGNOSIS — K5730 Diverticulosis of large intestine without perforation or abscess without bleeding: Secondary | ICD-10-CM

## 2022-07-15 DIAGNOSIS — Z1211 Encounter for screening for malignant neoplasm of colon: Secondary | ICD-10-CM

## 2022-12-17 ENCOUNTER — Other Ambulatory Visit: Payer: Self-pay | Admitting: Internal Medicine

## 2022-12-17 DIAGNOSIS — Z1231 Encounter for screening mammogram for malignant neoplasm of breast: Secondary | ICD-10-CM

## 2022-12-28 ENCOUNTER — Ambulatory Visit
Admission: RE | Admit: 2022-12-28 | Discharge: 2022-12-28 | Disposition: A | Discharge status: Home / Self Care | Payer: Self-pay | Source: Ambulatory Visit | Attending: Internal Medicine | Admitting: Internal Medicine

## 2022-12-28 DIAGNOSIS — Z1231 Encounter for screening mammogram for malignant neoplasm of breast: Secondary | ICD-10-CM | POA: Insufficient documentation

## 2022-12-29 ENCOUNTER — Other Ambulatory Visit: Payer: Self-pay | Admitting: Internal Medicine

## 2022-12-29 DIAGNOSIS — Z0000 Encounter for general adult medical examination without abnormal findings: Secondary | ICD-10-CM

## 2022-12-29 DIAGNOSIS — E7800 Pure hypercholesterolemia, unspecified: Secondary | ICD-10-CM

## 2023-01-05 ENCOUNTER — Ambulatory Visit
Admission: RE | Admit: 2023-01-05 | Discharge: 2023-01-05 | Disposition: A | Discharge status: Home / Self Care | Payer: Self-pay | Source: Ambulatory Visit | Attending: Internal Medicine | Admitting: Internal Medicine

## 2023-01-05 DIAGNOSIS — Z0000 Encounter for general adult medical examination without abnormal findings: Secondary | ICD-10-CM | POA: Insufficient documentation

## 2023-01-05 DIAGNOSIS — E7800 Pure hypercholesterolemia, unspecified: Secondary | ICD-10-CM | POA: Insufficient documentation

## 2023-02-07 ENCOUNTER — Ambulatory Visit (LOCAL_COMMUNITY_HEALTH_CENTER): Payer: Self-pay

## 2023-02-07 DIAGNOSIS — Z719 Counseling, unspecified: Secondary | ICD-10-CM

## 2023-02-07 DIAGNOSIS — Z23 Encounter for immunization: Secondary | ICD-10-CM

## 2023-02-07 NOTE — Progress Notes (Signed)
In nurse clinic for immunization. Patient requesting Flu. Patient paid for private Flu. Voices no concerns. VIS reviewed and given to patient. Vaccine tolerated well; no issues noted. [facility] updated and copy given to patient.   Abagail Kitchens, RN
